# Patient Record
Sex: Male | Born: 1980
Health system: Southern US, Community
[De-identification: ages and names within clinical notes are randomized; demographics above are authoritative.]

## PROBLEM LIST (undated history)

## (undated) DIAGNOSIS — I10 Essential (primary) hypertension: Secondary | ICD-10-CM

## (undated) DIAGNOSIS — Z9889 Other specified postprocedural states: Secondary | ICD-10-CM

## (undated) HISTORY — PX: WISDOM TOOTH EXTRACTION: SHX21

## (undated) HISTORY — DX: Other specified postprocedural states: Z98.890

## (undated) HISTORY — PX: ESOPHAGOGASTRODUODENOSCOPY: SHX1529

## (undated) HISTORY — DX: Essential (primary) hypertension: I10

## (undated) HISTORY — PX: BACK SURGERY: SHX140

---

## 2013-01-05 ENCOUNTER — Ambulatory Visit (INDEPENDENT_AMBULATORY_CARE_PROVIDER_SITE_OTHER): Payer: 59 | Admitting: Sports Medicine

## 2013-01-05 ENCOUNTER — Encounter: Payer: Self-pay | Admitting: Sports Medicine

## 2013-01-05 VITALS — BP 148/99 | HR 60 | Ht 67.0 in | Wt 244.0 lb

## 2013-01-05 DIAGNOSIS — Z0000 Encounter for general adult medical examination without abnormal findings: Secondary | ICD-10-CM | POA: Insufficient documentation

## 2013-01-05 DIAGNOSIS — Z299 Encounter for prophylactic measures, unspecified: Secondary | ICD-10-CM

## 2013-01-05 DIAGNOSIS — L237 Allergic contact dermatitis due to plants, except food: Secondary | ICD-10-CM | POA: Insufficient documentation

## 2013-01-05 DIAGNOSIS — I10 Essential (primary) hypertension: Secondary | ICD-10-CM

## 2013-01-05 DIAGNOSIS — L255 Unspecified contact dermatitis due to plants, except food: Secondary | ICD-10-CM

## 2013-01-05 MED ORDER — LOSARTAN POTASSIUM-HCTZ 100-25 MG PO TABS: 1.0000 | ORAL_TABLET | Freq: Every day | ORAL | Status: DC

## 2013-01-05 MED ORDER — NEBIVOLOL HCL 20 MG PO TABS: 20.0000 mg | ORAL_TABLET | Freq: Every day | ORAL | Status: DC

## 2013-01-05 NOTE — Assessment & Plan Note (Signed)
Checking routine bloodwork. 

## 2013-01-05 NOTE — Assessment & Plan Note (Signed)
Solu-Medrol 125 and Depo-Medrol 80 intramuscular in the office.

## 2013-01-05 NOTE — Progress Notes (Signed)
  Subjective:    CC: Establish care.   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  Hypertension: Has been on Bystolic and losartan for a long time now, needs refills. He has already been through thiazides as well as amlodipine and was intolerant.  Poison ivy dermatitis: Present for several days on both arms and groin, intensely pruritic, persistent.  Preventative measures: Due for some routine blood work.  Past medical history, Surgical history, Family history not pertinant except as noted below, Social history, Allergies, and medications have been entered into the medical record, reviewed, and no changes needed.   Review of Systems: No headache, visual changes, nausea, vomiting, diarrhea, constipation, dizziness, abdominal pain, skin rash, fevers, chills, night sweats, swollen lymph nodes, weight loss, chest pain, body aches, joint swelling, muscle aches, shortness of breath, mood changes, visual or auditory hallucinations.  Objective:    General: Well Developed, well nourished, and in no acute distress.  Neuro: Alert and oriented x3, extra-ocular muscles intact, sensation grossly intact.  HEENT: Normocephalic, atraumatic, pupils equal round reactive to light, neck supple, no masses, no lymphadenopathy, thyroid nonpalpable.  Skin: Warm and dry, there is a papulovesicular rash on both forearms as well as his groin.  Cardiac: Regular rate and rhythm, no murmurs rubs or gallops.  Respiratory: Clear to auscultation bilaterally. Not using accessory muscles, speaking in full sentences.  Abdominal: Soft, nontender, nondistended, positive bowel sounds, no masses, no organomegaly.  Musculoskeletal: Shoulder, elbow, wrist, hip, knee, ankle stable, and with full range of motion.  Impression and Recommendations:    The patient was counselled, risk factors were discussed, anticipatory guidance given.

## 2013-01-05 NOTE — Assessment & Plan Note (Signed)
Refilling Bystolic and Hyzaar.

## 2013-01-06 MED ORDER — METHYLPREDNISOLONE ACETATE 80 MG/ML IJ SUSP
80.0000 mg | Freq: Once | INTRAMUSCULAR | Status: AC
  Administered 2013-01-06: 80 mg via INTRAMUSCULAR

## 2013-01-06 MED ORDER — METHYLPREDNISOLONE SODIUM SUCC 125 MG IJ SOLR
125.0000 mg | Freq: Once | INTRAMUSCULAR | Status: AC
  Administered 2013-01-06: 125 mg via INTRAMUSCULAR

## 2013-01-06 NOTE — Addendum Note (Signed)
Addended by: Pixie Casino on: 01/06/2013 08:48 AM   Modules accepted: Orders

## 2013-03-01 ENCOUNTER — Other Ambulatory Visit: Payer: Self-pay | Admitting: Family Medicine

## 2013-03-01 DIAGNOSIS — L237 Allergic contact dermatitis due to plants, except food: Secondary | ICD-10-CM

## 2013-03-01 MED ORDER — PREDNISONE 50 MG PO TABS: 50.0000 mg | ORAL_TABLET | Freq: Every day | ORAL | Status: DC

## 2013-03-01 NOTE — Assessment & Plan Note (Addendum)
Prednisone 50mg  x7 days

## 2013-03-01 NOTE — Progress Notes (Signed)
Itchy rash on L foot 9 days ago.  Thinks came in contact w/ poison ivy/oak from cutting down bushes in back yard. Developed rash next day Has calamine lotion, benadryl, salt water soaks w/o much benefit.   PE mild papular erythematous rash on dorsum of foot and 3-4th toes.  Shelly Flatten, MD Family Medicine PGY-3 03/01/2013, 5:02 PM

## 2014-02-05 ENCOUNTER — Other Ambulatory Visit: Payer: Self-pay | Admitting: Sports Medicine

## 2014-02-05 DIAGNOSIS — I10 Essential (primary) hypertension: Secondary | ICD-10-CM

## 2014-02-05 MED ORDER — NEBIVOLOL HCL 20 MG PO TABS: 20.0000 mg | ORAL_TABLET | Freq: Every day | ORAL | Status: DC

## 2014-02-05 MED ORDER — LOSARTAN POTASSIUM-HCTZ 100-25 MG PO TABS: 1.0000 | ORAL_TABLET | Freq: Every day | ORAL | Status: DC

## 2014-04-16 ENCOUNTER — Encounter: Payer: Self-pay | Admitting: *Deleted

## 2014-04-16 NOTE — Progress Notes (Signed)
PPD placed on left forearm on 04/16/2014 at 3:30 pm. Lot # [id number]; Exp:05/09/2016  Read on 04/18/14 at 3:45 pm. Result is negative.Busick, Kevin Fenton

## 2015-02-12 ENCOUNTER — Encounter: Payer: Self-pay | Admitting: Sports Medicine

## 2015-02-12 ENCOUNTER — Other Ambulatory Visit: Payer: Self-pay | Admitting: Sports Medicine

## 2015-02-12 ENCOUNTER — Ambulatory Visit (INDEPENDENT_AMBULATORY_CARE_PROVIDER_SITE_OTHER): Payer: 59 | Admitting: Sports Medicine

## 2015-02-12 VITALS — BP 136/82 | HR 69 | Ht 67.0 in | Wt 241.0 lb

## 2015-02-12 DIAGNOSIS — Z0000 Encounter for general adult medical examination without abnormal findings: Secondary | ICD-10-CM | POA: Diagnosis not present

## 2015-02-12 DIAGNOSIS — E785 Hyperlipidemia, unspecified: Secondary | ICD-10-CM | POA: Diagnosis not present

## 2015-02-12 DIAGNOSIS — R011 Cardiac murmur, unspecified: Secondary | ICD-10-CM

## 2015-02-12 DIAGNOSIS — I10 Essential (primary) hypertension: Secondary | ICD-10-CM | POA: Diagnosis not present

## 2015-02-12 LAB — HEMOGLOBIN A1C
Hgb A1c MFr Bld: 5.2 % (ref <5.7)
Mean Plasma Glucose: 103 mg/dL (ref <117)

## 2015-02-12 MED ORDER — NEBIVOLOL HCL 20 MG PO TABS: 20.0000 mg | ORAL_TABLET | Freq: Every day | ORAL | Status: DC

## 2015-02-12 MED ORDER — AZILSARTAN MEDOXOMIL 80 MG PO TABS: ORAL_TABLET | ORAL | Status: DC

## 2015-02-12 MED ORDER — LOSARTAN POTASSIUM-HCTZ 100-25 MG PO TABS: 1.0000 | ORAL_TABLET | Freq: Every day | ORAL | Status: DC

## 2015-02-12 NOTE — Progress Notes (Signed)
  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 Denies any headaches, visual changes, chest pain.  Preventive measures: Has not yet obtained routine blood work.  Past medical history, Surgical history, Family history not pertinant except as noted below, Social history, Allergies, and medications have been entered into the medical record, reviewed, and no changes needed.   Review of Systems: No headache, visual changes, nausea, vomiting, diarrhea, constipation, dizziness, abdominal pain, skin rash, fevers, chills, night sweats, swollen lymph nodes, weight loss, chest pain, body aches, joint swelling, muscle aches, shortness of breath, mood changes, visual or auditory hallucinations.  Objective:    General: Well Developed, well nourished, and in no acute distress.  Neuro: Alert and oriented x3, extra-ocular muscles intact, sensation grossly intact. Cranial nerves II through XII are intact, motor, sensory, and coordinative functions are all intact. HEENT: Normocephalic, atraumatic, pupils equal round reactive to light, neck supple, no masses, no lymphadenopathy, thyroid nonpalpable. Oropharynx, nasopharynx, external ear canals are unremarkable. Skin: Warm and dry, no rashes noted.  Cardiac: Regular rate and rhythm, there is a 4-6/5 systolic ejection murmur heard best in the left third parasternal intercostal space. Respiratory: Clear to auscultation bilaterally. Not using accessory muscles, speaking in full sentences.  Abdominal: Soft, nontender, nondistended, positive bowel sounds, no masses, no organomegaly.  Musculoskeletal: Shoulder, elbow, wrist, hip, knee, ankle stable, and with full range of motion.  Impression and  Recommendations:    The patient was counselled, risk factors were discussed, anticipatory guidance given.

## 2015-02-12 NOTE — Assessment & Plan Note (Signed)
Continue Bystolic. Discontinue Hyzaar, switching to [place]

## 2015-02-12 NOTE — Assessment & Plan Note (Signed)
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

## 2015-02-12 NOTE — Assessment & Plan Note (Signed)
Annual physical exam as above, patient will go ahead and get his blood work drawn. We did discuss his weight, and he will try to lose some more weight on his own, we discussed dieting strategies, he will proceed with weight loss medication if no response.

## 2015-02-13 ENCOUNTER — Telehealth (HOSPITAL_COMMUNITY): Payer: Self-pay | Admitting: *Deleted

## 2015-02-13 DIAGNOSIS — E785 Hyperlipidemia, unspecified: Secondary | ICD-10-CM | POA: Insufficient documentation

## 2015-02-13 LAB — COMPREHENSIVE METABOLIC PANEL
ALT: 38 U/L (ref 9–46)
AST: 24 U/L (ref 10–40)
Albumin: 4.2 g/dL (ref 3.6–5.1)
Alkaline Phosphatase: 57 U/L (ref 40–115)
Calcium: 9.4 mg/dL (ref 8.6–10.3)
Sodium: 140 mmol/L (ref 135–146)
Total Bilirubin: 0.5 mg/dL (ref 0.2–1.2)
Total Protein: 6.9 g/dL (ref 6.1–8.1)

## 2015-02-13 LAB — TSH: TSH: 1.32 u[IU]/mL (ref 0.350–4.500)

## 2015-02-13 LAB — CBC
HCT: 44 % (ref 39.0–52.0)
Hemoglobin: 15.5 g/dL (ref 13.0–17.0)
MCH: 30 pg (ref 26.0–34.0)
MCHC: 35.2 g/dL (ref 30.0–36.0)
MCV: 85.3 fL (ref 78.0–100.0)
MPV: 11.4 fL (ref 8.6–12.4)
Platelets: 249 10*3/uL (ref 150–400)
RBC: 5.16 MIL/uL (ref 4.22–5.81)
RDW: 13.6 % (ref 11.5–15.5)
WBC: 7.5 10*3/uL (ref 4.0–10.5)

## 2015-02-13 LAB — COMPREHENSIVE METABOLIC PANEL WITH GFR
BUN: 15 mg/dL (ref 7–25)
CO2: 24 mmol/L (ref 20–31)
Chloride: 103 mmol/L (ref 98–110)
Creat: 1.1 mg/dL (ref 0.60–1.35)
Glucose, Bld: 116 mg/dL — ABNORMAL HIGH (ref 65–99)
Potassium: 3.8 mmol/L (ref 3.5–5.3)

## 2015-02-13 LAB — LIPID PANEL
Cholesterol: 192 mg/dL (ref 125–200)
HDL: 33 mg/dL — ABNORMAL LOW (ref >=40)
Total CHOL/HDL Ratio: 5.8 ratio — ABNORMAL HIGH (ref <=5.0)
Triglycerides: 518 mg/dL — ABNORMAL HIGH (ref <150)

## 2015-02-13 LAB — LDL CHOLESTEROL, DIRECT: Direct LDL: 108 mg/dL (ref <130)

## 2015-02-13 LAB — VITAMIN D 25 HYDROXY (VIT D DEFICIENCY, FRACTURES): Vit D, 25-Hydroxy: 17 ng/mL — ABNORMAL LOW (ref 30–100)

## 2015-02-13 MED ORDER — VITAMIN D (ERGOCALCIFEROL) 1.25 MG (50000 UNIT) PO CAPS: 50000.0000 [IU] | ORAL_CAPSULE | ORAL | Status: DC

## 2015-02-13 NOTE — Addendum Note (Signed)
Addended by: Silverio Decamp on: 02/13/2015 08:32 AM   Modules accepted: Orders

## 2015-02-13 NOTE — Assessment & Plan Note (Signed)
Triglycerides are up, adding direct LDL.

## 2015-05-06 ENCOUNTER — Other Ambulatory Visit: Payer: Self-pay | Admitting: Sports Medicine

## 2015-05-06 DIAGNOSIS — I10 Essential (primary) hypertension: Secondary | ICD-10-CM

## 2015-05-06 MED ORDER — AZILSARTAN MEDOXOMIL 80 MG PO TABS: ORAL_TABLET | ORAL | Status: DC

## 2015-05-15 ENCOUNTER — Other Ambulatory Visit: Payer: Self-pay | Admitting: Sports Medicine

## 2015-05-15 DIAGNOSIS — R011 Cardiac murmur, unspecified: Secondary | ICD-10-CM

## 2015-05-15 DIAGNOSIS — I10 Essential (primary) hypertension: Secondary | ICD-10-CM

## 2015-05-30 ENCOUNTER — Ambulatory Visit (HOSPITAL_COMMUNITY): Payer: 59

## 2015-06-25 MED FILL — BYSTOLIC 20 MG TABLET: 20 | 90 days supply | Qty: 90 | Fill #1

## 2015-06-26 ENCOUNTER — Ambulatory Visit (HOSPITAL_COMMUNITY): Payer: 59 | Attending: Cardiovascular Disease

## 2015-06-26 ENCOUNTER — Other Ambulatory Visit: Payer: Self-pay

## 2015-06-26 DIAGNOSIS — Z6837 Body mass index (BMI) 37.0-37.9, adult: Secondary | ICD-10-CM | POA: Diagnosis not present

## 2015-06-26 DIAGNOSIS — E785 Hyperlipidemia, unspecified: Secondary | ICD-10-CM | POA: Diagnosis not present

## 2015-06-26 DIAGNOSIS — I10 Essential (primary) hypertension: Secondary | ICD-10-CM | POA: Insufficient documentation

## 2015-06-26 DIAGNOSIS — R011 Cardiac murmur, unspecified: Secondary | ICD-10-CM | POA: Diagnosis present

## 2015-06-26 DIAGNOSIS — E669 Obesity, unspecified: Secondary | ICD-10-CM | POA: Diagnosis not present

## 2015-07-01 ENCOUNTER — Ambulatory Visit (INDEPENDENT_AMBULATORY_CARE_PROVIDER_SITE_OTHER): Payer: 59 | Admitting: Sports Medicine

## 2015-07-01 VITALS — BP 137/96 | HR 63 | Temp 98.2°F | Resp 16 | Ht 72.0 in | Wt 231.3 lb

## 2015-07-01 DIAGNOSIS — R011 Cardiac murmur, unspecified: Secondary | ICD-10-CM | POA: Diagnosis not present

## 2015-07-01 DIAGNOSIS — D229 Melanocytic nevi, unspecified: Secondary | ICD-10-CM | POA: Insufficient documentation

## 2015-07-01 DIAGNOSIS — I10 Essential (primary) hypertension: Secondary | ICD-10-CM

## 2015-07-01 DIAGNOSIS — D239 Other benign neoplasm of skin, unspecified: Secondary | ICD-10-CM | POA: Diagnosis not present

## 2015-07-01 MED ORDER — NEBIVOLOL HCL 20 MG PO TABS: 20.0000 mg | ORAL_TABLET | Freq: Every day | ORAL | Status: DC

## 2015-07-01 MED ORDER — AZILSARTAN MEDOXOMIL 80 MG PO TABS: ORAL_TABLET | ORAL | Status: DC

## 2015-07-01 NOTE — Assessment & Plan Note (Signed)
Refilling Edarbi, Bystolic.

## 2015-07-01 NOTE — Progress Notes (Signed)
  Subjective:    CC: Follow-up  HPI: Hypertension: Improved on current medications  Systolic murmur: Echo was negative.  Atypical nevi: Wonders if he needs to see a dermatologist.  Past medical history, Surgical history, Family history not pertinant except as noted below, Social history, Allergies, and medications have been entered into the medical record, reviewed, and no changes needed.   Review of Systems: No fevers, chills, night sweats, weight loss, chest pain, or shortness of breath.   Objective:    General: Well Developed, well nourished, and in no acute distress.  Neuro: Alert and oriented x3, extra-ocular muscles intact, sensation grossly intact.  HEENT: Normocephalic, atraumatic, pupils equal round reactive to light, neck supple, no masses, no lymphadenopathy, thyroid nonpalpable.  Skin: Warm and dry, no rashes. There are several atypical nevi, 2 of them are worrisome, one of the abdomen and one on the back that are asymmetric, variegated, with irregular borders, and approximately 1.5cm across. Cardiac: Regular rate and rhythm, no murmurs rubs or gallops, no lower extremity edema.  Respiratory: Clear to auscultation bilaterally. Not using accessory muscles, speaking in full sentences.  Impression and Recommendations:

## 2015-07-01 NOTE — Assessment & Plan Note (Signed)
Benign murmur, echo was negative.

## 2015-07-01 NOTE — Assessment & Plan Note (Signed)
Patient will return for shave biopsies, there was one on the abdomen and one on the back that were asymmetric, variegated.

## 2015-07-12 MED FILL — EDARBI 80 MG TABLET: 80 | 30 days supply | Qty: 30 | Fill #0

## 2015-08-13 MED FILL — EDARBI 80 MG TABLET: 80 | 30 days supply | Qty: 30 | Fill #1

## 2015-09-16 MED FILL — EDARBI 80 MG TABLET: 80 | 30 days supply | Qty: 30 | Fill #2

## 2015-10-02 MED FILL — BYSTOLIC 20 MG TABLET: 20 | 90 days supply | Qty: 90 | Fill #2

## 2015-10-25 MED FILL — EDARBI 80 MG TABLET: 80 | 30 days supply | Qty: 30 | Fill #3

## 2015-11-25 MED FILL — EDARBI 80 MG TABLET: 80 | 30 days supply | Qty: 30 | Fill #4

## 2015-12-15 ENCOUNTER — Other Ambulatory Visit: Payer: Self-pay | Admitting: Family Medicine

## 2015-12-15 ENCOUNTER — Encounter: Payer: Self-pay | Admitting: Family Medicine

## 2015-12-15 MED ORDER — AMOXICILLIN 500 MG PO CAPS: 500.0000 mg | ORAL_CAPSULE | Freq: Two times a day (BID) | ORAL | Status: DC

## 2015-12-15 NOTE — Progress Notes (Signed)
Reports ear pain and fullness x1 day. Noted some sinus pressure in the last several days. Exam shows significant erythema and bulging of right ear. Left tympanic membrane normal. 10 day course of Amoxicillin prescribed.   Dr. Gerlean Ren 12/15/15, 6:15 PM

## 2016-01-02 MED FILL — EDARBI 80 MG TABLET: 80 | 30 days supply | Qty: 30 | Fill #5

## 2016-01-06 MED FILL — BYSTOLIC 20 MG TABLET: 20 | 90 days supply | Qty: 90 | Fill #3

## 2016-01-20 ENCOUNTER — Other Ambulatory Visit: Payer: Self-pay | Admitting: Sports Medicine

## 2016-01-20 ENCOUNTER — Encounter: Payer: Self-pay | Admitting: Sports Medicine

## 2016-01-20 ENCOUNTER — Ambulatory Visit (INDEPENDENT_AMBULATORY_CARE_PROVIDER_SITE_OTHER): Payer: 59 | Admitting: Sports Medicine

## 2016-01-20 ENCOUNTER — Ambulatory Visit (INDEPENDENT_AMBULATORY_CARE_PROVIDER_SITE_OTHER): Payer: 59

## 2016-01-20 DIAGNOSIS — R509 Fever, unspecified: Secondary | ICD-10-CM

## 2016-01-20 DIAGNOSIS — D229 Melanocytic nevi, unspecified: Secondary | ICD-10-CM

## 2016-01-20 DIAGNOSIS — D239 Other benign neoplasm of skin, unspecified: Secondary | ICD-10-CM

## 2016-01-20 DIAGNOSIS — R61 Generalized hyperhidrosis: Secondary | ICD-10-CM

## 2016-01-20 DIAGNOSIS — R102 Pelvic and perineal pain: Secondary | ICD-10-CM

## 2016-01-20 DIAGNOSIS — K651 Peritoneal abscess: Secondary | ICD-10-CM

## 2016-01-20 DIAGNOSIS — E785 Hyperlipidemia, unspecified: Secondary | ICD-10-CM

## 2016-01-20 LAB — PSA: PSA: 0.4 ng/mL (ref <=4.0)

## 2016-01-20 LAB — CBC WITH DIFFERENTIAL/PLATELET
Basophils Absolute: 267 {cells}/uL — ABNORMAL HIGH (ref 0–200)
Basophils Relative: 3 %
Eosinophils Absolute: 89 cells/uL (ref 15–500)
Eosinophils Relative: 1 %
HCT: 43.5 % (ref 38.5–50.0)
Hemoglobin: 15.3 g/dL (ref 13.2–17.1)
Lymphocytes Relative: 55 %
Lymphs Abs: 4895 {cells}/uL — ABNORMAL HIGH (ref 850–3900)
MCH: 30.2 pg (ref 27.0–33.0)
MCHC: 35.2 g/dL (ref 32.0–36.0)
MCV: 86 fL (ref 80.0–100.0)
MPV: 9.9 fL (ref 7.5–12.5)
Monocytes Absolute: 712 cells/uL (ref 200–950)
Monocytes Relative: 8 %
Neutro Abs: 2937 cells/uL (ref 1500–7800)
Neutrophils Relative %: 33 %
Platelets: 227 K/uL (ref 140–400)
RBC: 5.06 MIL/uL (ref 4.20–5.80)
RDW: 13.4 % (ref 11.0–15.0)
WBC: 8.9 K/uL (ref 3.8–10.8)

## 2016-01-20 LAB — COMPREHENSIVE METABOLIC PANEL WITH GFR
ALT: 145 U/L — ABNORMAL HIGH (ref 9–46)
AST: 60 U/L — ABNORMAL HIGH (ref 10–40)
Alkaline Phosphatase: 110 U/L (ref 40–115)
BUN: 14 mg/dL (ref 7–25)
CO2: 28 mmol/L (ref 20–31)
Calcium: 9.7 mg/dL (ref 8.6–10.3)
Creat: 1.23 mg/dL (ref 0.60–1.35)
Glucose, Bld: 108 mg/dL — ABNORMAL HIGH (ref 65–99)
Sodium: 136 mmol/L (ref 135–146)
Total Bilirubin: 0.9 mg/dL (ref 0.2–1.2)

## 2016-01-20 LAB — LIPID PANEL
Cholesterol: 204 mg/dL — ABNORMAL HIGH (ref 125–200)
HDL: 31 mg/dL — ABNORMAL LOW (ref >=40)
LDL Cholesterol: 124 mg/dL (ref <130)
Total CHOL/HDL Ratio: 6.6 ratio — ABNORMAL HIGH (ref <=5.0)
Triglycerides: 244 mg/dL — ABNORMAL HIGH (ref <150)
VLDL: 49 mg/dL — ABNORMAL HIGH (ref <30)

## 2016-01-20 LAB — COMPREHENSIVE METABOLIC PANEL
Albumin: 4.1 g/dL (ref 3.6–5.1)
Chloride: 102 mmol/L (ref 98–110)
Potassium: 4.7 mmol/L (ref 3.5–5.3)
Total Protein: 7.7 g/dL (ref 6.1–8.1)

## 2016-01-20 MED ORDER — IOPAMIDOL (ISOVUE-300) INJECTION 61%
100.0000 mL | Freq: Once | INTRAVENOUS | Status: AC | PRN
  Administered 2016-01-20: 100 mL via INTRAVENOUS

## 2016-01-20 MED ORDER — CIPROFLOXACIN HCL 750 MG PO TABS: 750.0000 mg | ORAL_TABLET | Freq: Two times a day (BID) | ORAL | 0 refills | Status: AC

## 2016-01-20 MED ORDER — DOXYCYCLINE HYCLATE 100 MG PO TABS: 100.0000 mg | ORAL_TABLET | Freq: Two times a day (BID) | ORAL | 0 refills | Status: AC

## 2016-01-20 MED ORDER — METRONIDAZOLE 500 MG PO TABS: 500.0000 mg | ORAL_TABLET | Freq: Two times a day (BID) | ORAL | 0 refills | Status: AC

## 2016-01-20 MED FILL — DOXYCYCLINE HYCLATE 100 MG: 100 | 7 days supply | Qty: 14 | Fill #0

## 2016-01-20 MED FILL — metroNIDAZOLE 500 MG TABS: 500 | 7 days supply | Qty: 14 | Fill #0

## 2016-01-20 MED FILL — CIPROFLOXACIN HCL 750 MG TA: 750 | 10 days supply | Qty: 20 | Fill #0

## 2016-01-20 NOTE — Assessment & Plan Note (Addendum)
Question viral serology versus ischioanal abscess. History of a worsening anal fissure initially. CBC, CMP, viral serologies, Quantiferon Gold, blood cultures. Calling the lab to add on a PSA CT abdomen and pelvis with IV contrast.

## 2016-01-20 NOTE — Assessment & Plan Note (Signed)
Rechecking lipids. 

## 2016-01-20 NOTE — Progress Notes (Signed)
  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 He did have a history of a anal fissure, that has worsened in pain. No cough, shortness of breath, sore throat, no sinus pressure, no abdominal pain, nausea, vomiting, diarrhea, constipation. No shortness of breath, no skin or soft tissue infections.  [place]: Has noted 2 on his buttocks that he is worried about  Past medical history, Surgical history, Family history not pertinant except as noted below, Social history, Allergies, and medications have been entered into the medical record, reviewed, and no changes needed.   Review of Systems: No fevers, chills, night sweats, weight loss, chest pain, or shortness of breath.   Objective:    General: Well Developed, well nourished, and in no acute distress.  Neuro: Alert and oriented x3, extra-ocular muscles intact, sensation grossly intact.  HEENT: Normocephalic, atraumatic, pupils equal round reactive to light, neck supple, no masses, no lymphadenopathy, thyroid nonpalpable.  Skin: Warm and dry, no rashes.There are a few nevi on his buttock, a couple are very dark and variegated. Cardiac: Regular rate and rhythm, no murmurs rubs or gallops, no lower extremity edema.  Respiratory: Clear to auscultation bilaterally. Not using accessory muscles, speaking in full sentences. Abdomen: Soft, nontender, not distended, normal bowel sounds, no palpable masses, guarding, rigidity, or rebound tenderness. Rectal: Minimally erythematous, visible external hemorrhoid at about 12:00, there is significant pain with palpation of his rectum.  CT of the pelvis with contrast does not show any evidence of collections.  Impression and Recommendations:    Abscess of male pelvis ([place]) Question viral serology versus ischioanal abscess. History of a worsening anal fissure initially. CBC, CMP, viral  serologies, Quantiferon Gold, blood cultures. Calling the lab to add on a PSA CT abdomen and pelvis with IV contrast.  Atypical nevi Variegated and dark nevi on both buttocks, he will return for biopsy.  Hyperlipidemia Rechecking lipids

## 2016-01-20 NOTE — Assessment & Plan Note (Signed)
Variegated and dark nevi on both buttocks, he will return for biopsy.

## 2016-01-21 LAB — EPSTEIN-BARR VIRUS VCA, IGM: EBV VCA IgM: >160 U/mL — ABNORMAL HIGH

## 2016-01-21 LAB — EPSTEIN-BARR VIRUS VCA, IGG: EBV VCA IgG: 562 U/mL — ABNORMAL HIGH

## 2016-01-21 LAB — CMV IGM: CMV IgM: >240 AU/mL — ABNORMAL HIGH (ref <30.00)

## 2016-01-21 LAB — PATHOLOGIST SMEAR REVIEW

## 2016-01-22 LAB — QUANTIFERON TB GOLD ASSAY (BLOOD)
Interferon Gamma Release Assay: NEGATIVE
Mitogen-Nil: >10 [IU]/mL
Quantiferon Nil Value: 0.15 IU/mL
Quantiferon Tb Ag Minus Nil Value: 0.01 [IU]/mL

## 2016-01-26 LAB — CULTURE, BLOOD (SINGLE): Organism ID, Bacteria: NO GROWTH

## 2016-02-03 ENCOUNTER — Encounter: Payer: Self-pay | Admitting: Sports Medicine

## 2016-02-03 MED ORDER — NITROGLYCERIN 0.4 % RE OINT: 1.0000 [in_us] | TOPICAL_OINTMENT | Freq: Two times a day (BID) | RECTAL | 3 refills | Status: DC | PRN

## 2016-02-18 MED FILL — EDARBI 80 MG TABLET: 80 | 30 days supply | Qty: 30 | Fill #6

## 2016-03-17 MED FILL — EDARBI 80 MG TABLET: 80 | 30 days supply | Qty: 30 | Fill #7

## 2016-04-23 MED FILL — EDARBI 80 MG TABLET: 80 | 30 days supply | Qty: 30 | Fill #8

## 2016-04-23 MED FILL — BYSTOLIC 20 MG TABLET: 20 | 90 days supply | Qty: 90 | Fill #0

## 2016-06-11 MED FILL — EDARBI 80 MG TABLET: 80 | 30 days supply | Qty: 30 | Fill #9

## 2016-07-27 MED FILL — AZITHROMYCIN 250 MG TABLET: 250 | 5 days supply | Qty: 6 | Fill #0

## 2016-08-13 ENCOUNTER — Ambulatory Visit (INDEPENDENT_AMBULATORY_CARE_PROVIDER_SITE_OTHER): Payer: 59 | Admitting: Sports Medicine

## 2016-08-13 ENCOUNTER — Encounter: Payer: Self-pay | Admitting: Sports Medicine

## 2016-08-13 DIAGNOSIS — D229 Melanocytic nevi, unspecified: Secondary | ICD-10-CM | POA: Diagnosis not present

## 2016-08-13 DIAGNOSIS — I10 Essential (primary) hypertension: Secondary | ICD-10-CM

## 2016-08-13 DIAGNOSIS — Z0000 Encounter for general adult medical examination without abnormal findings: Secondary | ICD-10-CM

## 2016-08-13 DIAGNOSIS — E7800 Pure hypercholesterolemia, unspecified: Secondary | ICD-10-CM

## 2016-08-13 LAB — COMPREHENSIVE METABOLIC PANEL WITH GFR
ALT: 22 U/L (ref 9–46)
AST: 23 U/L (ref 10–40)
Alkaline Phosphatase: 78 U/L (ref 40–115)
BUN: 14 mg/dL (ref 7–25)
Creat: 1.05 mg/dL (ref 0.60–1.35)
Glucose, Bld: 83 mg/dL (ref 65–99)
Potassium: 4.7 mmol/L (ref 3.5–5.3)
Sodium: 137 mmol/L (ref 135–146)

## 2016-08-13 LAB — LIPID PANEL W/REFLEX DIRECT LDL
Cholesterol: 174 mg/dL (ref <200)
HDL: 67 mg/dL (ref >40)
LDL-Cholesterol: 92 mg/dL
Non-HDL Cholesterol (Calc): 107 mg/dL (ref <130)
Total CHOL/HDL Ratio: 2.6 ratio (ref <5.0)
Triglycerides: 64 mg/dL (ref <150)

## 2016-08-13 LAB — CBC
HCT: 48.6 % (ref 38.5–50.0)
Hemoglobin: 16.3 g/dL (ref 13.2–17.1)
MCH: 30.5 pg (ref 27.0–33.0)
MCHC: 33.5 g/dL (ref 32.0–36.0)
MCV: 90.8 fL (ref 80.0–100.0)
MPV: 10.5 fL (ref 7.5–12.5)
Platelets: 209 K/uL (ref 140–400)
RBC: 5.35 MIL/uL (ref 4.20–5.80)
RDW: 14.5 % (ref 11.0–15.0)
WBC: 6.4 K/uL (ref 3.8–10.8)

## 2016-08-13 LAB — COMPREHENSIVE METABOLIC PANEL
Albumin: 4.3 g/dL (ref 3.6–5.1)
CO2: 26 mmol/L (ref 20–31)
Calcium: 9.5 mg/dL (ref 8.6–10.3)
Chloride: 103 mmol/L (ref 98–110)
Total Bilirubin: 1.6 mg/dL — ABNORMAL HIGH (ref 0.2–1.2)
Total Protein: 7.4 g/dL (ref 6.1–8.1)

## 2016-08-13 LAB — HEMOGLOBIN A1C
Hgb A1c MFr Bld: 4.8 % (ref <5.7)
Mean Plasma Glucose: 91 mg/dL

## 2016-08-13 NOTE — Progress Notes (Signed)
  Subjective:    CC: Annual physical exam  HPI:  This is a very pleasant 36 year old male, he is currently a sports medicine fellow. He has been offered a job by [employer]. Overall he is doing well.  Suspicious nevi: Never returned for biopsy.  Hyperlipidemia: Has lost a significant amount of weight and is eager to recheck.  Hypertension: Discontinued his Edarbi, blood pressure continues to be controlled. He is still taking Bystolic.  Past medical history:  Negative.  See flowsheet/record as well for more information.  Surgical history: Negative.  See flowsheet/record as well for more information.  Family history: Negative.  See flowsheet/record as well for more information.  Social history: Negative.  See flowsheet/record as well for more information.  Allergies, and medications have been entered into the medical record, reviewed, and no changes needed.    Review of Systems: No headache, visual changes, nausea, vomiting, diarrhea, constipation, dizziness, abdominal pain, skin rash, fevers, chills, night sweats, swollen lymph nodes, weight loss, chest pain, body aches, joint swelling, muscle aches, shortness of breath, mood changes, visual or auditory hallucinations.  Objective:    General: Well Developed, well nourished, and in no acute distress.  Neuro: Alert and oriented x3, extra-ocular muscles intact, sensation grossly intact. Cranial nerves II through XII are intact, motor, sensory, and coordinative functions are all intact. HEENT: Normocephalic, atraumatic, pupils equal round reactive to light, neck supple, no masses, no lymphadenopathy, thyroid nonpalpable. Oropharynx, nasopharynx, external ear canals are unremarkable. Skin: Warm and dry, no rashes noted.  Cardiac: Regular rate and rhythm, no murmurs rubs or gallops.  Respiratory: Clear to auscultation bilaterally. Not using accessory muscles, speaking in full sentences.  Abdominal: Soft, nontender, nondistended, positive bowel  sounds, no masses, no organomegaly.  Musculoskeletal: Shoulder, elbow, wrist, hip, knee, ankle stable, and with full range of motion.  Impression and Recommendations:    The patient was counselled, risk factors were discussed, anticipatory guidance given.  Essential hypertension, benign Essentially diet controlled now, he will discontinue his Bystolic and keep me updated regarding his blood pressures.   Annual physical exam Physical as above, routine blood work ordered.  Atypical nevi Patient does need to return for biopsy, nevi on both buttocks are variegated and dark.  Hyperlipidemia Rechecking lipids, he has lost a great deal of weight

## 2016-08-13 NOTE — Assessment & Plan Note (Signed)
Physical as above, routine blood work ordered.

## 2016-08-13 NOTE — Assessment & Plan Note (Signed)
Rechecking lipids, he has lost a great deal of weight

## 2016-08-13 NOTE — Assessment & Plan Note (Signed)
Essentially diet controlled now, he will discontinue his Bystolic and keep me updated regarding his blood pressures.

## 2016-08-13 NOTE — Assessment & Plan Note (Signed)
Patient does need to return for biopsy, nevi on both buttocks are variegated and dark.

## 2016-08-14 LAB — VITAMIN D 25 HYDROXY (VIT D DEFICIENCY, FRACTURES): Vit D, 25-Hydroxy: 32 ng/mL (ref 30–100)

## 2016-08-14 LAB — HIV ANTIBODY (ROUTINE TESTING W REFLEX): HIV 1&2 Ab, 4th Generation: NONREACTIVE

## 2017-04-14 DIAGNOSIS — H5213 Myopia, bilateral: Secondary | ICD-10-CM | POA: Diagnosis not present

## 2017-04-14 DIAGNOSIS — D485 Neoplasm of uncertain behavior of skin: Secondary | ICD-10-CM | POA: Diagnosis not present

## 2017-04-14 DIAGNOSIS — L814 Other melanin hyperpigmentation: Secondary | ICD-10-CM | POA: Diagnosis not present

## 2017-04-14 DIAGNOSIS — D229 Melanocytic nevi, unspecified: Secondary | ICD-10-CM | POA: Diagnosis not present

## 2017-04-14 DIAGNOSIS — D225 Melanocytic nevi of trunk: Secondary | ICD-10-CM | POA: Diagnosis not present

## 2017-04-14 DIAGNOSIS — L821 Other seborrheic keratosis: Secondary | ICD-10-CM | POA: Diagnosis not present

## 2017-06-21 ENCOUNTER — Other Ambulatory Visit: Payer: Self-pay | Admitting: Family Medicine

## 2017-06-21 MED ORDER — AMOXICILLIN-POT CLAVULANATE 875-125 MG PO TABS: 1.0000 | ORAL_TABLET | Freq: Two times a day (BID) | ORAL | 0 refills | Status: AC

## 2017-06-21 MED FILL — AMOX-CLAV 875-125 MG TABLET: 875-125 | 10 days supply | Qty: 20 | Fill #0

## 2017-06-21 NOTE — Progress Notes (Signed)
augmen

## 2017-07-12 ENCOUNTER — Other Ambulatory Visit: Payer: Self-pay | Admitting: Nurse Practitioner

## 2017-07-12 DIAGNOSIS — S61239A Puncture wound without foreign body of unspecified finger without damage to nail, initial encounter: Secondary | ICD-10-CM

## 2017-07-12 MED ORDER — CEPHALEXIN 500 MG PO CAPS: 500.0000 mg | ORAL_CAPSULE | Freq: Two times a day (BID) | ORAL | 0 refills | Status: DC

## 2017-07-12 MED FILL — CEPHALEXIN 500 MG CAPSULE: 500 | 7 days supply | Qty: 14 | Fill #0

## 2017-07-12 NOTE — Progress Notes (Signed)
Puncture wound with purulent drainage. Swelling and redness along DIP. Normal joint ROM. No joint effusion. Normal cap refill.

## 2017-07-13 ENCOUNTER — Other Ambulatory Visit: Payer: Self-pay | Admitting: *Deleted

## 2017-07-13 MED ORDER — DOXYCYCLINE HYCLATE 100 MG PO TABS: 100.0000 mg | ORAL_TABLET | Freq: Two times a day (BID) | ORAL | 0 refills | Status: DC

## 2017-07-13 MED FILL — DOXYCYCLINE HYCLATE 100 MG: 100 | 10 days supply | Qty: 20 | Fill #0

## 2017-07-15 ENCOUNTER — Encounter (HOSPITAL_COMMUNITY): Payer: Self-pay | Admitting: Surgery

## 2017-07-15 ENCOUNTER — Ambulatory Visit (HOSPITAL_COMMUNITY): Payer: 59 | Admitting: Certified Registered"

## 2017-07-15 ENCOUNTER — Other Ambulatory Visit: Payer: Self-pay

## 2017-07-15 ENCOUNTER — Ambulatory Visit (HOSPITAL_COMMUNITY)
Admission: AD | Admit: 2017-07-15 | Discharge: 2017-07-15 | Disposition: A | Discharge status: Home / Self Care | Payer: 59 | Source: Ambulatory Visit | Attending: Orthopedic Surgery | Admitting: Orthopedic Surgery

## 2017-07-15 ENCOUNTER — Encounter (HOSPITAL_COMMUNITY)
Admission: AD | Disposition: A | Discharge status: Home / Self Care | Payer: Self-pay | Source: Ambulatory Visit | Attending: Orthopedic Surgery

## 2017-07-15 DIAGNOSIS — L02512 Cutaneous abscess of left hand: Secondary | ICD-10-CM | POA: Diagnosis not present

## 2017-07-15 DIAGNOSIS — I10 Essential (primary) hypertension: Secondary | ICD-10-CM | POA: Diagnosis not present

## 2017-07-15 DIAGNOSIS — E669 Obesity, unspecified: Secondary | ICD-10-CM | POA: Diagnosis not present

## 2017-07-15 DIAGNOSIS — I96 Gangrene, not elsewhere classified: Secondary | ICD-10-CM | POA: Diagnosis not present

## 2017-07-15 DIAGNOSIS — Z6831 Body mass index (BMI) 31.0-31.9, adult: Secondary | ICD-10-CM | POA: Diagnosis not present

## 2017-07-15 DIAGNOSIS — Z79899 Other long term (current) drug therapy: Secondary | ICD-10-CM | POA: Insufficient documentation

## 2017-07-15 DIAGNOSIS — M71042 Abscess of bursa, left hand: Secondary | ICD-10-CM | POA: Diagnosis not present

## 2017-07-15 DIAGNOSIS — R011 Cardiac murmur, unspecified: Secondary | ICD-10-CM | POA: Diagnosis not present

## 2017-07-15 DIAGNOSIS — L03012 Cellulitis of left finger: Secondary | ICD-10-CM

## 2017-07-15 DIAGNOSIS — E785 Hyperlipidemia, unspecified: Secondary | ICD-10-CM | POA: Diagnosis not present

## 2017-07-15 HISTORY — PX: INCISION AND DRAINAGE WOUND WITH FOREIGN BODY REMOVAL: SHX5635

## 2017-07-15 LAB — BASIC METABOLIC PANEL
Anion gap: 12 (ref 5–15)
BUN: 9 mg/dL (ref 6–20)
CHLORIDE: 103 mmol/L (ref 101–111)
CO2: 22 mmol/L (ref 22–32)
Calcium: 9.2 mg/dL (ref 8.9–10.3)
Creatinine, Ser: 0.94 mg/dL (ref 0.61–1.24)
GFR calc Af Amer: >60 mL/min (ref >60)
Glucose, Bld: 95 mg/dL (ref 65–99)
POTASSIUM: 3.8 mmol/L (ref 3.5–5.1)
SODIUM: 137 mmol/L (ref 135–145)

## 2017-07-15 LAB — HEMOGLOBIN: HEMOGLOBIN: 14.7 g/dL (ref 13.0–17.0)

## 2017-07-15 SURGERY — INCISION AND DRAINAGE WOUND WITH FOREIGN BODY REMOVAL
Anesthesia: General | Site: Hand | Laterality: Left

## 2017-07-15 MED ORDER — MIDAZOLAM HCL 2 MG/2ML IJ SOLN: 0.5000 mg | Freq: Once | INTRAMUSCULAR | Status: DC | PRN

## 2017-07-15 MED ORDER — ONDANSETRON HCL 4 MG/2ML IJ SOLN
INTRAMUSCULAR | Status: AC
  Filled 2017-07-15: qty 2

## 2017-07-15 MED ORDER — CHLORHEXIDINE GLUCONATE 4 % EX LIQD: 60.0000 mL | Freq: Once | CUTANEOUS | Status: DC

## 2017-07-15 MED ORDER — DEXAMETHASONE SODIUM PHOSPHATE 10 MG/ML IJ SOLN
INTRAMUSCULAR | Status: DC | PRN
  Administered 2017-07-15: 10 mg via INTRAVENOUS

## 2017-07-15 MED ORDER — LACTATED RINGERS IV SOLN
INTRAVENOUS | Status: DC
  Administered 2017-07-15: 15:00:00 via INTRAVENOUS

## 2017-07-15 MED ORDER — BUPIVACAINE HCL (PF) 0.25 % IJ SOLN
INTRAMUSCULAR | Status: AC
  Filled 2017-07-15: qty 30

## 2017-07-15 MED ORDER — MEPERIDINE HCL 50 MG/ML IJ SOLN: 6.2500 mg | INTRAMUSCULAR | Status: DC | PRN

## 2017-07-15 MED ORDER — FENTANYL CITRATE (PF) 250 MCG/5ML IJ SOLN
INTRAMUSCULAR | Status: AC
  Filled 2017-07-15: qty 5

## 2017-07-15 MED ORDER — PROPOFOL 10 MG/ML IV BOLUS
INTRAVENOUS | Status: AC
  Filled 2017-07-15: qty 20

## 2017-07-15 MED ORDER — LIDOCAINE 2% (20 MG/ML) 5 ML SYRINGE
INTRAMUSCULAR | Status: DC | PRN
  Administered 2017-07-15: 60 mg via INTRAVENOUS

## 2017-07-15 MED ORDER — LIDOCAINE 2% (20 MG/ML) 5 ML SYRINGE
INTRAMUSCULAR | Status: AC
  Filled 2017-07-15: qty 5

## 2017-07-15 MED ORDER — PROPOFOL 10 MG/ML IV BOLUS
INTRAVENOUS | Status: DC | PRN
  Administered 2017-07-15: 200 mg via INTRAVENOUS

## 2017-07-15 MED ORDER — CEFAZOLIN SODIUM-DEXTROSE 2-4 GM/100ML-% IV SOLN
2.0000 g | INTRAVENOUS | Status: AC
  Administered 2017-07-15: 2 g via INTRAVENOUS
  Filled 2017-07-15: qty 100

## 2017-07-15 MED ORDER — HYDROMORPHONE HCL 1 MG/ML IJ SOLN
INTRAMUSCULAR | Status: AC
  Administered 2017-07-15: 0.5 mg via INTRAVENOUS
  Filled 2017-07-15: qty 1

## 2017-07-15 MED ORDER — MIDAZOLAM HCL 2 MG/2ML IJ SOLN
INTRAMUSCULAR | Status: DC | PRN
  Administered 2017-07-15: 2 mg via INTRAVENOUS

## 2017-07-15 MED ORDER — FENTANYL CITRATE (PF) 250 MCG/5ML IJ SOLN
INTRAMUSCULAR | Status: DC | PRN
  Administered 2017-07-15: 100 ug via INTRAVENOUS
  Administered 2017-07-15: 50 ug via INTRAVENOUS

## 2017-07-15 MED ORDER — PROMETHAZINE HCL 25 MG/ML IJ SOLN
6.2500 mg | INTRAMUSCULAR | Status: DC | PRN
Start: 2017-07-15 — End: 2017-07-15

## 2017-07-15 MED ORDER — HYDROMORPHONE HCL 1 MG/ML IJ SOLN
INTRAMUSCULAR | Status: AC
  Filled 2017-07-15: qty 1

## 2017-07-15 MED ORDER — FENTANYL CITRATE (PF) 100 MCG/2ML IJ SOLN
INTRAMUSCULAR | Status: AC
  Administered 2017-07-15: 50 ug via INTRAVENOUS
  Filled 2017-07-15: qty 2

## 2017-07-15 MED ORDER — FENTANYL CITRATE (PF) 100 MCG/2ML IJ SOLN
50.0000 ug | Freq: Once | INTRAMUSCULAR | Status: AC
Start: 2017-07-15 — End: 2017-07-15
  Administered 2017-07-15: 50 ug via INTRAVENOUS
  Filled 2017-07-15: qty 1

## 2017-07-15 MED ORDER — SODIUM CHLORIDE 0.9 % IR SOLN
Status: DC | PRN
  Administered 2017-07-15: 1000 mL
  Administered 2017-07-15: 3000 mL

## 2017-07-15 MED ORDER — MIDAZOLAM HCL 2 MG/2ML IJ SOLN
INTRAMUSCULAR | Status: AC
  Filled 2017-07-15: qty 2

## 2017-07-15 MED ORDER — ONDANSETRON HCL 4 MG/2ML IJ SOLN
INTRAMUSCULAR | Status: DC | PRN
  Administered 2017-07-15: 4 mg via INTRAVENOUS

## 2017-07-15 MED ORDER — HYDROMORPHONE HCL 1 MG/ML IJ SOLN
0.2500 mg | INTRAMUSCULAR | Status: DC | PRN
  Administered 2017-07-15 (×3): 0.5 mg via INTRAVENOUS

## 2017-07-15 MED ORDER — DEXAMETHASONE SODIUM PHOSPHATE 10 MG/ML IJ SOLN
INTRAMUSCULAR | Status: AC
  Filled 2017-07-15: qty 1

## 2017-07-15 SURGICAL SUPPLY — 59 items
BANDAGE ACE 3X5.8 VEL STRL LF (GAUZE/BANDAGES/DRESSINGS) ×3 IMPLANT
BANDAGE ACE 4X5 VEL STRL LF (GAUZE/BANDAGES/DRESSINGS) ×3 IMPLANT
BNDG COHESIVE 1X5 TAN STRL LF (GAUZE/BANDAGES/DRESSINGS) IMPLANT
BNDG CONFORM 2 STRL LF (GAUZE/BANDAGES/DRESSINGS) IMPLANT
BNDG ESMARK 4X9 LF (GAUZE/BANDAGES/DRESSINGS) ×3 IMPLANT
BNDG GAUZE ELAST 4 BULKY (GAUZE/BANDAGES/DRESSINGS) ×3 IMPLANT
CORD BIPOLAR FORCEPS 12FT (ELECTRODE) ×3 IMPLANT
CORDS BIPOLAR (ELECTRODE) ×3 IMPLANT
COVER SURGICAL LIGHT HANDLE (MISCELLANEOUS) ×3 IMPLANT
CUFF TOURNIQUET SINGLE 18IN (TOURNIQUET CUFF) ×3 IMPLANT
CUFF TOURNIQUET SINGLE 24IN (TOURNIQUET CUFF) IMPLANT
DRAIN PENROSE 1/4X12 LTX STRL (WOUND CARE) IMPLANT
DRAPE SURG 17X23 STRL (DRAPES) ×3 IMPLANT
DRSG ADAPTIC 3X8 NADH LF (GAUZE/BANDAGES/DRESSINGS) ×3 IMPLANT
ELECT REM PT RETURN 9FT ADLT (ELECTROSURGICAL)
ELECTRODE REM PT RTRN 9FT ADLT (ELECTROSURGICAL) IMPLANT
GAUZE SPONGE 4X4 12PLY STRL (GAUZE/BANDAGES/DRESSINGS) ×3 IMPLANT
GAUZE XEROFORM 1X8 LF (GAUZE/BANDAGES/DRESSINGS) ×3 IMPLANT
GAUZE XEROFORM 5X9 LF (GAUZE/BANDAGES/DRESSINGS) IMPLANT
GLOVE BIOGEL PI IND STRL 8.5 (GLOVE) ×1 IMPLANT
GLOVE BIOGEL PI INDICATOR 8.5 (GLOVE) ×2
GLOVE SURG ORTHO 8.0 STRL STRW (GLOVE) ×3 IMPLANT
GOWN STRL REUS W/ TWL LRG LVL3 (GOWN DISPOSABLE) ×3 IMPLANT
GOWN STRL REUS W/ TWL XL LVL3 (GOWN DISPOSABLE) ×1 IMPLANT
GOWN STRL REUS W/TWL LRG LVL3 (GOWN DISPOSABLE) ×6
GOWN STRL REUS W/TWL XL LVL3 (GOWN DISPOSABLE) ×2
HANDPIECE INTERPULSE COAX TIP (DISPOSABLE)
KIT BASIN OR (CUSTOM PROCEDURE TRAY) ×3 IMPLANT
KIT ROOM TURNOVER OR (KITS) ×3 IMPLANT
LOOP VESSEL MINI RED (MISCELLANEOUS) ×3 IMPLANT
MANIFOLD NEPTUNE II (INSTRUMENTS) ×3 IMPLANT
NEEDLE HYPO 25GX1X1/2 BEV (NEEDLE) IMPLANT
NS IRRIG 1000ML POUR BTL (IV SOLUTION) ×3 IMPLANT
PACK ORTHO EXTREMITY (CUSTOM PROCEDURE TRAY) ×3 IMPLANT
PAD ARMBOARD 7.5X6 YLW CONV (MISCELLANEOUS) ×6 IMPLANT
PAD CAST 4YDX4 CTTN HI CHSV (CAST SUPPLIES) ×1 IMPLANT
PADDING CAST COTTON 4X4 STRL (CAST SUPPLIES) ×2
SET CYSTO W/LG BORE CLAMP LF (SET/KITS/TRAYS/PACK) ×3 IMPLANT
SET HNDPC FAN SPRY TIP SCT (DISPOSABLE) IMPLANT
SOAP 2 % CHG 4 OZ (WOUND CARE) ×3 IMPLANT
SPLINT FIBERGLASS 3X35 (CAST SUPPLIES) ×3 IMPLANT
SPONGE LAP 18X18 X RAY DECT (DISPOSABLE) ×3 IMPLANT
SPONGE LAP 4X18 X RAY DECT (DISPOSABLE) ×3 IMPLANT
SUCTION FRAZIER HANDLE 10FR (MISCELLANEOUS) ×2
SUCTION TUBE FRAZIER 10FR DISP (MISCELLANEOUS) ×1 IMPLANT
SUT ETHILON 4 0 PS 2 18 (SUTURE) IMPLANT
SUT ETHILON 5 0 P 3 18 (SUTURE)
SUT NYLON ETHILON 5-0 P-3 1X18 (SUTURE) IMPLANT
SUT PROLENE 4 0 PS 2 18 (SUTURE) ×3 IMPLANT
SWAB COLLECTION DEVICE MRSA (MISCELLANEOUS) ×3 IMPLANT
SWAB CULTURE ESWAB REG 1ML (MISCELLANEOUS) IMPLANT
SYR CONTROL 10ML LL (SYRINGE) IMPLANT
TOWEL OR 17X24 6PK STRL BLUE (TOWEL DISPOSABLE) ×3 IMPLANT
TOWEL OR 17X26 10 PK STRL BLUE (TOWEL DISPOSABLE) ×3 IMPLANT
TUBE CONNECTING 12'X1/4 (SUCTIONS) ×1
TUBE CONNECTING 12X1/4 (SUCTIONS) ×2 IMPLANT
UNDERPAD 30X30 (UNDERPADS AND DIAPERS) ×3 IMPLANT
WATER STERILE IRR 1000ML POUR (IV SOLUTION) ×3 IMPLANT
YANKAUER SUCT BULB TIP NO VENT (SUCTIONS) ×3 IMPLANT

## 2017-07-15 NOTE — Anesthesia Preprocedure Evaluation (Signed)
Anesthesia Evaluation  Patient identified by MRN, date of birth, ID band Patient awake    Reviewed: Allergy & Precautions, NPO status , Patient's Chart, lab work & pertinent test results  History of Anesthesia Complications Negative for: history of anesthetic complications  Airway Mallampati: II  TM Distance: >3 FB Neck ROM: Full    Dental  (+) Chipped, Caps, Dental Advisory Given   Pulmonary neg pulmonary ROS,    breath sounds clear to auscultation       Cardiovascular hypertension (off of meds since lost weight), (-) angina Rhythm:Regular Rate:Normal  '17 ECHO: EF 55-60%, valves OK   Neuro/Psych negative neurological ROS     GI/Hepatic negative GI ROS, Neg liver ROS,   Endo/Other  negative endocrine ROS  Renal/GU negative Renal ROS     Musculoskeletal   Abdominal (+) + obese,   Peds  Hematology negative hematology ROS (+)   Anesthesia Other Findings   Reproductive/Obstetrics                             Anesthesia Physical Anesthesia Plan  ASA: II  Anesthesia Plan: General   Post-op Pain Management:    Induction: Intravenous  PONV Risk Score and Plan: 3 and Ondansetron and Dexamethasone  Airway Management Planned: LMA  Additional Equipment:   Intra-op Plan:   Post-operative Plan:   Informed Consent: I have reviewed the patients History and Physical, chart, labs and discussed the procedure including the risks, benefits and alternatives for the proposed anesthesia with the patient or authorized representative who has indicated his/her understanding and acceptance.   Dental advisory given  Plan Discussed with: CRNA and Surgeon  Anesthesia Plan Comments: (Plan routine monitors, GA- LMA OK)        Anesthesia Quick Evaluation

## 2017-07-15 NOTE — H&P (Signed)
  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 About 4 days ago, a blister appeared on the finger. He was able to drain it and began taking Doxycycline.  He has had continued swelling, redness, warmth, and drainage from the finger. He denies fever, chills, nausea, vomiting, or diarrhea.  He was seen in our office for further evaluation. Discussed the reason and rationale for surgical intervention. Discussed the surgical procedure, including the risks versus benefits, and the post-operative recovery.  The patient is here today for surgery.   Past Medical History:  Diagnosis Date  . History of microdiscectomy 2006 and 2014   herniated disc  . Hypertension     No past surgical history on file.  No family history on file. Social History:  reports that  has never smoked. he has never used smokeless tobacco. He reports that he drinks alcohol. He reports that he does not use drugs.  Allergies: No Known Allergies  No medications prior to admission.    No results found for this or any previous visit (from the past 48 hour(s)). No results found.  ROS NO RECENT ILLNESSES OR HOSPITALIZATIONS  There were no vitals taken for this visit. Physical Exam  General Appearance:  Alert, cooperative, no distress, appears stated age  Head:  Normocephalic, without obvious abnormality, atraumatic  Eyes:  Pupils equal, conjunctiva/corneas clear,         Throat: Lips, mucosa, and tongue normal; teeth and gums normal  Neck: No visible masses     Lungs:   respirations unlabored  Chest Wall:  No tenderness or deformity  Heart:  Regular rate and rhythm,  Abdomen:   Soft, non-tender,         Extremities: LUE: marked swelling dorsally, fluctuant area dorsally index finger Limited mobility to long/ring/small  Pulses: 2+ and symmetric  Skin: Skin  color, texture, turgor normal, no rashes or lesions     Neurologic: Normal    Assessment LEFT INDEX FINGER ABSCESS   Plan LEFT INDEX FINGER INCISION AND DRAINAGE  R/B/A DISCUSSED WITH PT IN OFFICE.  PT VOICED UNDERSTANDING OF PLAN CONSENT SIGNED DAY OF SURGERY PT SEEN AND EXAMINED PRIOR TO OPERATIVE PROCEDURE/DAY OF SURGERY SITE MARKED. QUESTIONS ANSWERED WILL GO HOME FOLLOWING SURGERY  WE ARE PLANNING SURGERY FOR YOUR UPPER EXTREMITY. THE RISKS AND BENEFITS OF SURGERY INCLUDE BUT NOT LIMITED TO BLEEDING INFECTION, DAMAGE TO NEARBY NERVES ARTERIES TENDONS, FAILURE OF SURGERY TO ACCOMPLISH ITS INTENDED GOALS, PERSISTENT SYMPTOMS AND NEED FOR FURTHER SURGICAL INTERVENTION. WITH THIS IN MIND WE WILL PROCEED. I HAVE DISCUSSED WITH THE PATIENT THE PRE AND POSTOPERATIVE REGIMEN AND THE DOS AND DON'TS. PT VOICED UNDERSTANDING AND INFORMED CONSENT SIGNED.   Brynda Peon 07/15/2017, 9:42 AM

## 2017-07-15 NOTE — Op Note (Signed)
PREOPERATIVE DIAGNOSIS: Left index finger and dorsum of the hand abscess  POSTOPERATIVE DIAGNOSIS: Same  ATTENDING SURGEON: Dr. Gavin Pound who was scrubbed and present for the entire procedure  ASSISTANT SURGEON: None  ANESTHESIA: Gen. via laryngeal mask airway  OPERATIVE PROCEDURE: #1: Incision and drainage of dorsum aspect of left index finger and hand abscess #2: Excisional debridement of skin subcutaneous tissue of devitalized tissue  IMPLANTS: 2 small red vessel loops  RADIOGRAPHIC INTERPRETATION: None  SURGICAL INDICATIONS: Right-hand-dominant gentleman who had a worsening infection over the dorsal aspect of his index finger and hand. Patient presented the office with the worsening infection. Is recommended the patient undergo the above procedure. Risks benefits and alternatives were discussed in detail the patient in a signed informed consent was obtained. Risks include but not limited to bleeding infection damage to nearby nerves arteries or tendons loss of motion of the wrists and digits incomplete relief of symptoms and need for further surgical intervention.  SURGICAL TECHNIQUE: Patient is probably identified in the preoperative holding area and a mark with a permanent marker made a left index finger to indicate the correct operative site patient brought back to operating room placed supine on anesthesia and table where general anesthesia was administered. A well-padded tourniquet was placed on the left brachium and sealed with the appropriate drape. Left upper extremity is and prepped and draped in normal sterile fashion. A timeout was called the correct site was identified and the procedure then begun. A longitudinal incision was then made directly over the abscess area directly over the PIP joint to the index finger. Dissection carried down through the skin and subcutaneous tissue. Purulence was encountered and wound cultures were taken. Following this excisional debridement of the  skin and subcutaneous tissue and necrotic tissue was then done sharply with scissors and a knife. Following this a counter incision was made directly over the dorsal aspect of the hand. Blunt dissection was then done to carry out extension between these 2 incisions to allow for thorough wound irrigation. 3 L solution was then irrigated throughout the index finger and dorsum of the hand. The wound was irrigated thoroughly. Upon completion of the irrigation 2 vessel loops were then placed in the wound was then loosely reapproximated and closed over the vessel loops status service drains. Following this the Adaptic dressing a sterile compressive bandage was then applied. The patient is then placed in a well-padded volar splint and extubated taken recovery room in good condition.  POSTOPERATIVE PLAN: Patient be discharged to home. Seen back in the office tomorrow for wound check take out the drains and likely begin a therapy regimen. Continue on oral antibiotics until we follow his wound cultures.

## 2017-07-15 NOTE — Anesthesia Procedure Notes (Signed)
Procedure Name: LMA Insertion Date/Time: 07/15/2017 5:14 PM Performed by: Barrington Ellison, CRNA Pre-anesthesia Checklist: Patient identified, Emergency Drugs available, Suction available and Patient being monitored Patient Re-evaluated:Patient Re-evaluated prior to induction Oxygen Delivery Method: Circle System Utilized Preoxygenation: Pre-oxygenation with 100% oxygen Induction Type: IV induction Ventilation: Mask ventilation without difficulty LMA: LMA inserted LMA Size: 5.0 Number of attempts: 1 Placement Confirmation: positive ETCO2 Tube secured with: Tape Dental Injury: Teeth and Oropharynx as per pre-operative assessment

## 2017-07-15 NOTE — Discharge Instructions (Signed)
KEEP BANDAGE CLEAN AND DRY CALL OFFICE FOR F/U APPT [phone] in One day KEEP HAND ELEVATED ABOVE HEART OK TO APPLY ICE TO OPERATIVE AREA CONTACT OFFICE IF ANY WORSENING PAIN OR CONCERNS.

## 2017-07-15 NOTE — Anesthesia Postprocedure Evaluation (Signed)
Anesthesia Post Note  Patient: Victor Ax, MD  Procedure(s) Performed: LEFT INDEX FINGER INCISION AND DRAINAGE (Left Hand)     Patient location during evaluation: PACU Anesthesia Type: General Level of consciousness: awake and alert, patient cooperative and oriented Pain management: pain level controlled Vital Signs Assessment: post-procedure vital signs reviewed and stable Respiratory status: spontaneous breathing, nonlabored ventilation and respiratory function stable Cardiovascular status: blood pressure returned to baseline and stable Postop Assessment: no apparent nausea or vomiting Anesthetic complications: no Comments: Pt and wife understand importance of resuming BP meds    Last Vitals:  Vitals:   07/15/17 1836 07/15/17 1840  BP: (!) 167/104 (!) 167/104  Pulse: 85 75  Resp: 20 17  Temp:  36.9 C  SpO2: 95% 94%    Last Pain:  Vitals:   07/15/17 1833  TempSrc:   PainSc: Asleep                 Janesha Brissette,E. Eidan Muellner

## 2017-07-15 NOTE — Transfer of Care (Signed)
Immediate Anesthesia Transfer of Care Note  Patient: Victor Ax, MD  Procedure(s) Performed: LEFT INDEX FINGER INCISION AND DRAINAGE (Left Hand)  Patient Location: PACU  Anesthesia Type:General  Level of Consciousness: awake, alert  and oriented  Airway & Oxygen Therapy: Patient Spontanous Breathing  Post-op Assessment: Report given to RN  Post vital signs: Reviewed and stable  Last Vitals:  Vitals:   07/15/17 1350  BP: (!) 176/103  Pulse: 90  Resp: 19  Temp: 36.8 C  SpO2: 97%    Last Pain:  Vitals:   07/15/17 1416  TempSrc:   PainSc: 9       Patients Stated Pain Goal: 5 (65/46/50 3546)  Complications: No apparent anesthesia complications

## 2017-07-16 ENCOUNTER — Encounter (HOSPITAL_COMMUNITY): Payer: Self-pay | Admitting: Orthopedic Surgery

## 2017-07-20 LAB — AEROBIC/ANAEROBIC CULTURE W GRAM STAIN (SURGICAL/DEEP WOUND): Gram Stain: NONE SEEN

## 2017-07-20 LAB — AEROBIC/ANAEROBIC CULTURE (SURGICAL/DEEP WOUND)

## 2017-10-18 DIAGNOSIS — F4323 Adjustment disorder with mixed anxiety and depressed mood: Secondary | ICD-10-CM | POA: Diagnosis not present

## 2017-10-27 DIAGNOSIS — F4323 Adjustment disorder with mixed anxiety and depressed mood: Secondary | ICD-10-CM | POA: Diagnosis not present

## 2017-10-29 DIAGNOSIS — L02519 Cutaneous abscess of unspecified hand: Secondary | ICD-10-CM | POA: Diagnosis not present

## 2017-11-03 DIAGNOSIS — F4323 Adjustment disorder with mixed anxiety and depressed mood: Secondary | ICD-10-CM | POA: Diagnosis not present

## 2017-12-08 DIAGNOSIS — F4323 Adjustment disorder with mixed anxiety and depressed mood: Secondary | ICD-10-CM | POA: Diagnosis not present

## 2018-04-02 IMAGING — CT CT PELVIS W/ CM
2 of 4 series · 15 of 46 positions shown, 18 images · IV contrast (iopamidol)
Comparison: None.

CLINICAL DATA: Perianal pain for 2 weeks.  Fever, night sweats.

EXAM:
CT PELVIS WITH CONTRAST
TECHNIQUE: Multidetector CT imaging of the pelvis was performed using the
standard protocol following the bolus administration of intravenous
contrast.
CONTRAST:  100mL [id number]-KFF IOPAMIDOL ([id number]-KFF) INJECTION 61%

[Series 5: coronal st · coronal · 0.71mm/px · 3 of 142 slices shown]
[im 48/142  soft-tissue]
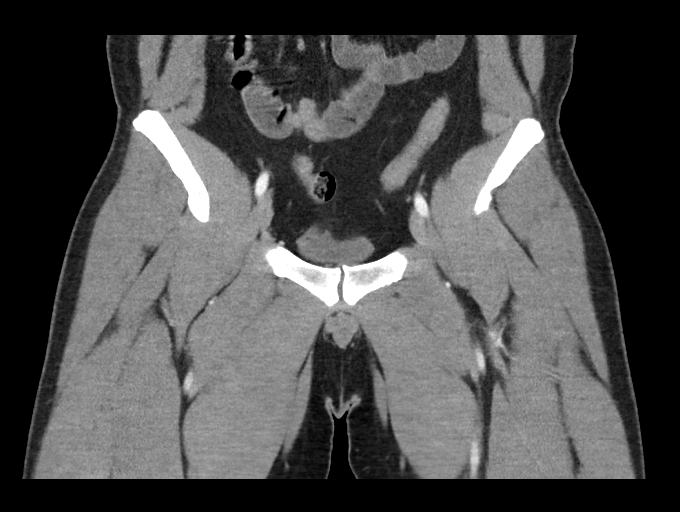
[im 63/142  soft-tissue]
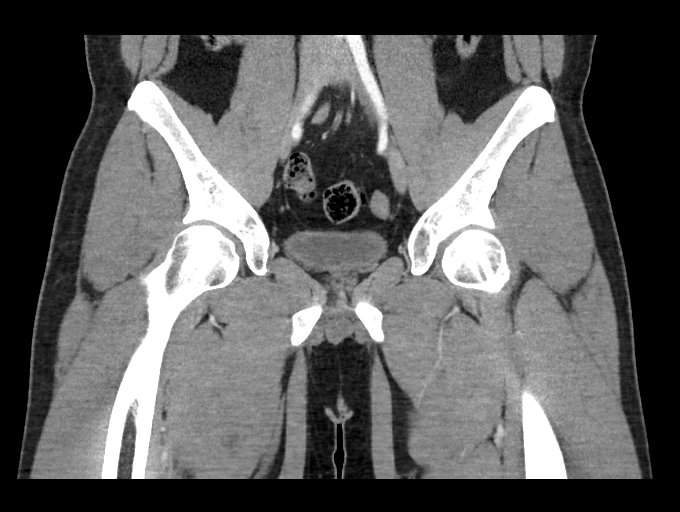
[im 79/142  soft-tissue]
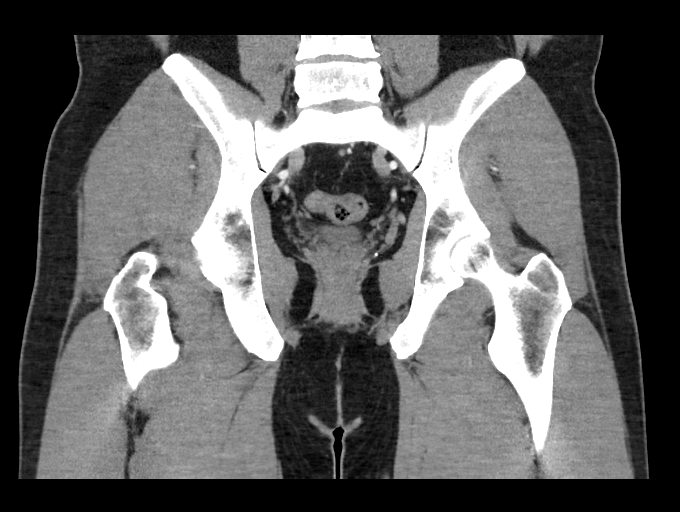

[Series 10: axial soft · axial · 0.98mm/px · z∈[-428,-146]mm · 12 of 158 slices shown, 15 images]
[im 11/158  soft-tissue]
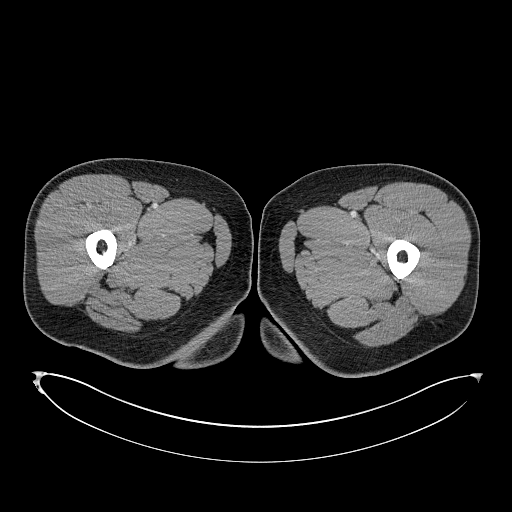
[im 11/158  bone]
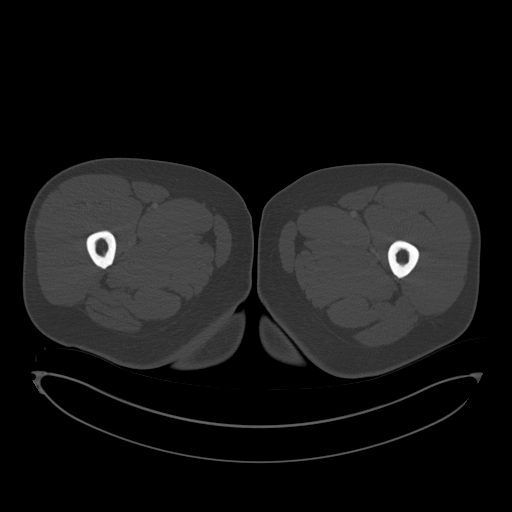
[im 31/158  soft-tissue]
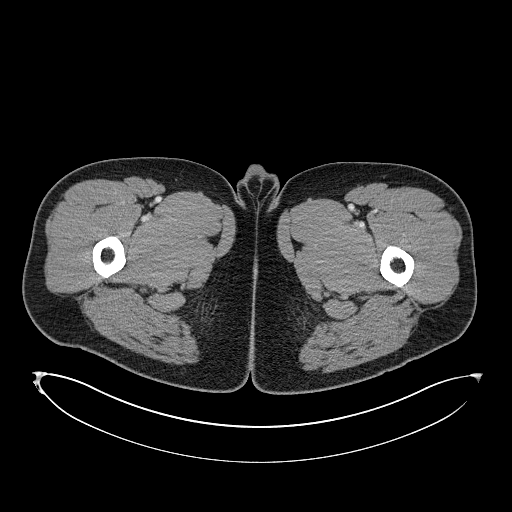
[im 46/158  soft-tissue]
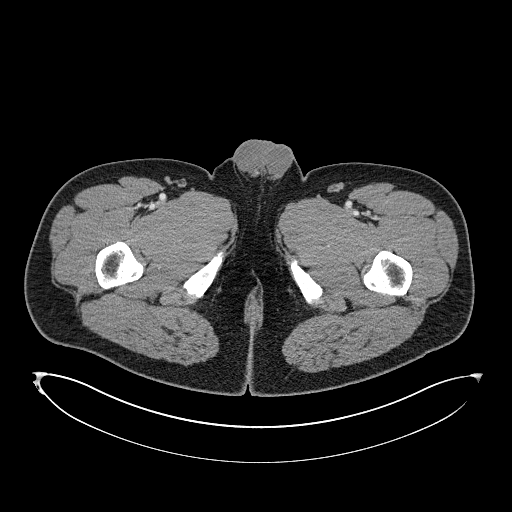
[im 61/158  soft-tissue]
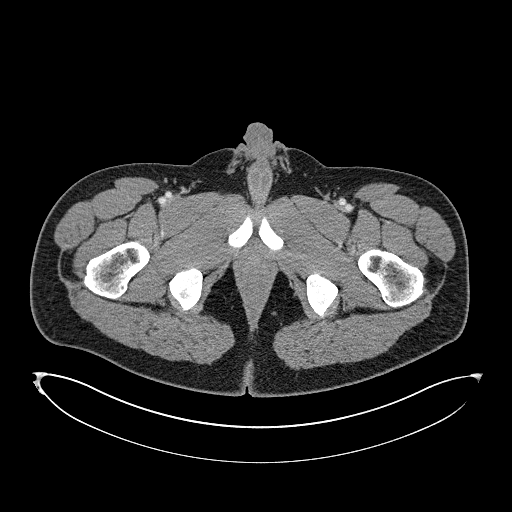
[im 82/158  soft-tissue]
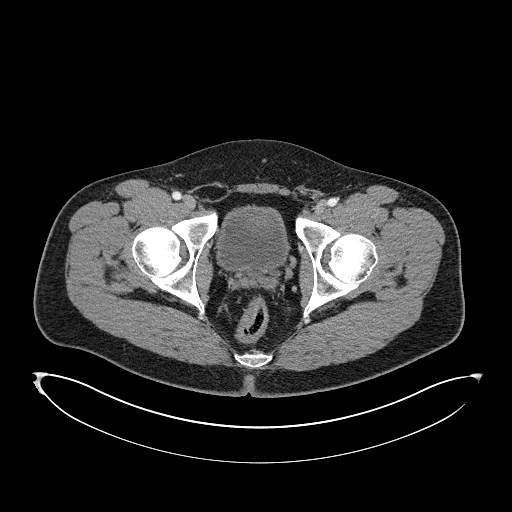
[im 97/158  soft-tissue]
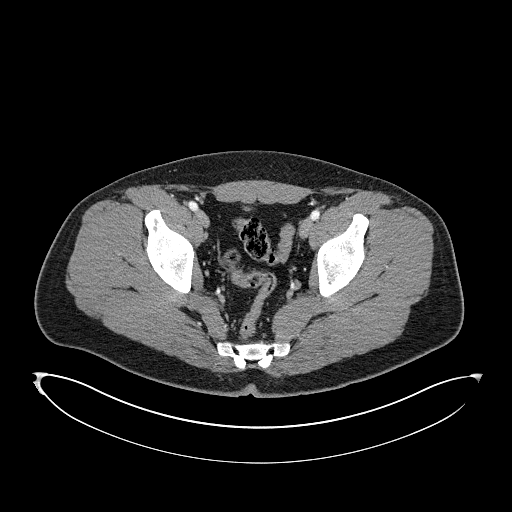
[im 112/158  soft-tissue]
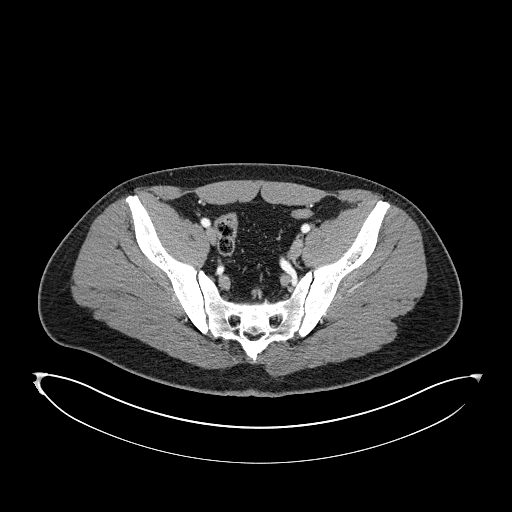
[im 132/158  soft-tissue]
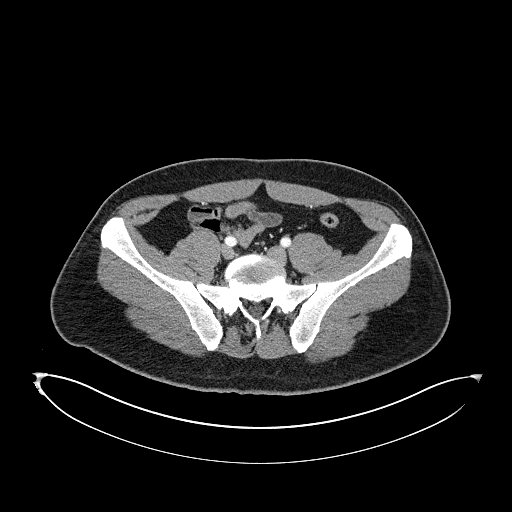
[im 137/158  lung]
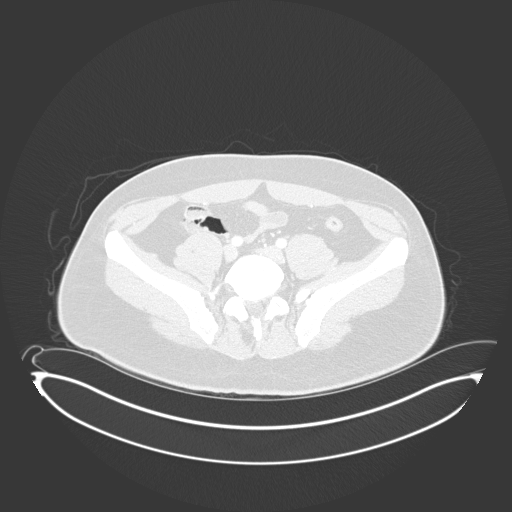
[im 142/158  lung]
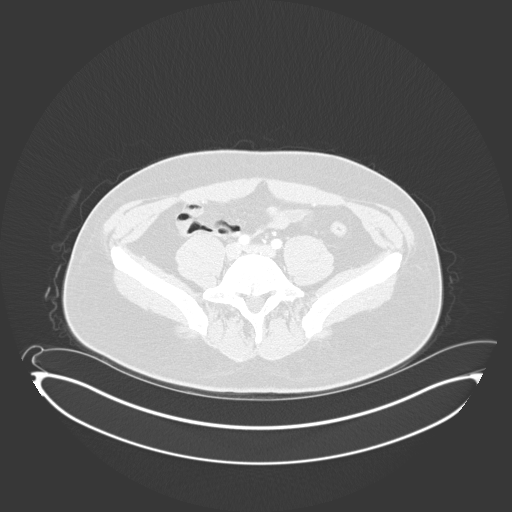
[im 147/158  soft-tissue]
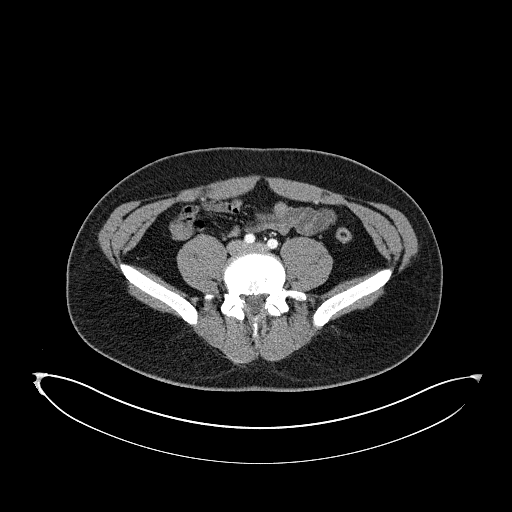
[im 147/158  lung]
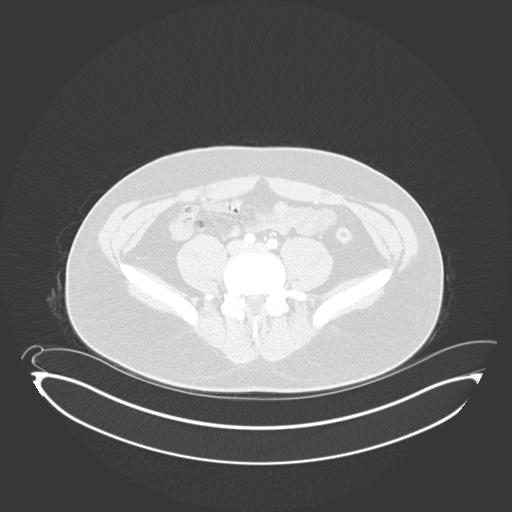
[im 147/158  bone]
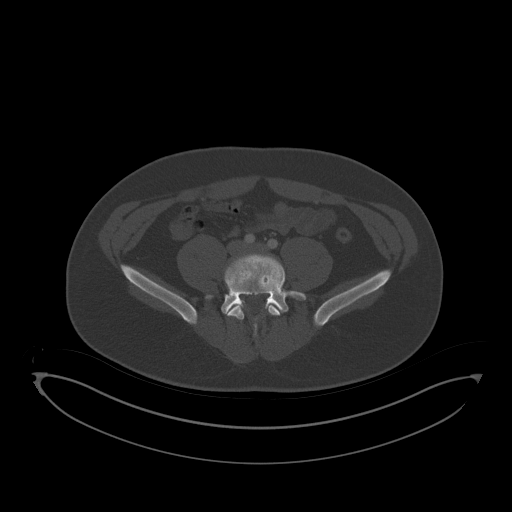
[im 152/158  lung]
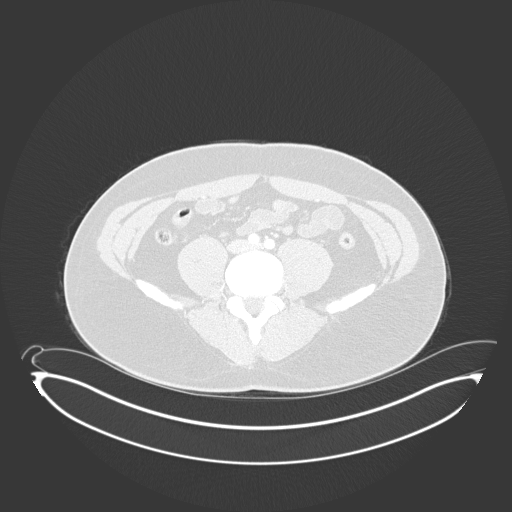

[15 of 46 positions shown; findings below may reference images not displayed]

FINDINGS: Urinary Tract: visualized ureters are decompressed. Urinary bladder
decompressed, grossly unremarkable.

Bowel: Visualized large and small bowel unremarkable. No visible
perianal abnormality.

Vascular/Lymphatic: Iliac and femoral vessels are normal caliber and
patent. No adenopathy.

Reproductive: No visible focal abnormality.

Other: No free fluid or free air. No soft tissue abnormality in the
perianal region.

Musculoskeletal: No acute bony abnormality or focal bone lesion.
IMPRESSION: No acute findings or significant abnormality.

## 2018-06-13 ENCOUNTER — Other Ambulatory Visit: Payer: Self-pay | Admitting: Nurse Practitioner

## 2018-06-13 DIAGNOSIS — J209 Acute bronchitis, unspecified: Secondary | ICD-10-CM

## 2018-06-13 MED ORDER — ALBUTEROL SULFATE 108 (90 BASE) MCG/ACT IN AEPB: 1.0000 | INHALATION_SPRAY | Freq: Four times a day (QID) | RESPIRATORY_TRACT | 0 refills | Status: DC | PRN

## 2018-06-13 MED ORDER — AZITHROMYCIN 250 MG PO TABS: 250.0000 mg | ORAL_TABLET | Freq: Every day | ORAL | 0 refills | Status: DC

## 2018-06-13 MED FILL — PROAIR RESPICLICK INHAL PWD: 108 (90 BAS | 50 days supply | Qty: 1 | Fill #0

## 2018-06-13 MED FILL — AZITHROMYCIN 250 MG TABS: 250 | 5 days supply | Qty: 6 | Fill #0

## 2018-08-12 ENCOUNTER — Other Ambulatory Visit: Payer: Self-pay | Admitting: Nurse Practitioner

## 2018-08-12 MED ORDER — AMOXICILLIN-POT CLAVULANATE 875-125 MG PO TABS: 1.0000 | ORAL_TABLET | Freq: Two times a day (BID) | ORAL | 0 refills | Status: DC

## 2018-08-12 MED FILL — AMOX-CLAV 875-125 MG TABLET: 875-125 | 7 days supply | Qty: 14 | Fill #0

## 2019-11-01 DIAGNOSIS — F4324 Adjustment disorder with disturbance of conduct: Secondary | ICD-10-CM | POA: Diagnosis not present

## 2019-11-08 DIAGNOSIS — F4324 Adjustment disorder with disturbance of conduct: Secondary | ICD-10-CM | POA: Diagnosis not present

## 2019-11-18 ENCOUNTER — Telehealth (HOSPITAL_COMMUNITY): Payer: Self-pay | Admitting: Emergency Medicine

## 2019-11-18 MED ORDER — AMOXICILLIN-POT CLAVULANATE 875-125 MG PO TABS: 1.0000 | ORAL_TABLET | Freq: Two times a day (BID) | ORAL | 0 refills | Status: DC

## 2019-11-18 NOTE — Telephone Encounter (Signed)
Rx for sialadenitis

## 2019-11-22 ENCOUNTER — Telehealth (HOSPITAL_COMMUNITY): Payer: Self-pay | Admitting: Urgent Care

## 2019-11-22 MED ORDER — HYDROCODONE-ACETAMINOPHEN 5-325 MG PO TABS: 1.0000 | ORAL_TABLET | Freq: Four times a day (QID) | ORAL | 0 refills | Status: DC | PRN

## 2019-11-22 NOTE — Telephone Encounter (Signed)
Patient had dental extraction yesterday for cracked tooth. Has had persistent severe pain not helped by NSAID, APAP, tramadol. Will trial hydrocodone as needed for breakthrough severe dental pain.

## 2019-11-23 MED FILL — HYDROCODON-APAP 5-325: 5-325 | 2 days supply | Qty: 10 | Fill #0

## 2019-11-30 DIAGNOSIS — F4324 Adjustment disorder with disturbance of conduct: Secondary | ICD-10-CM | POA: Diagnosis not present

## 2019-12-14 DIAGNOSIS — F4324 Adjustment disorder with disturbance of conduct: Secondary | ICD-10-CM | POA: Diagnosis not present

## 2020-01-18 DIAGNOSIS — F4324 Adjustment disorder with disturbance of conduct: Secondary | ICD-10-CM | POA: Diagnosis not present

## 2020-02-05 DIAGNOSIS — F4324 Adjustment disorder with disturbance of conduct: Secondary | ICD-10-CM | POA: Diagnosis not present

## 2020-02-26 ENCOUNTER — Telehealth: Payer: Self-pay | Admitting: Sports Medicine

## 2020-02-27 MED FILL — FLUARIX QUADRIVALENT 0.5 ML: 0.5 | 1 days supply | Qty: 1 | Fill #0

## 2020-02-28 NOTE — Telephone Encounter (Signed)
Error

## 2020-03-06 ENCOUNTER — Encounter: Payer: Self-pay | Admitting: Sports Medicine

## 2020-03-06 ENCOUNTER — Ambulatory Visit (INDEPENDENT_AMBULATORY_CARE_PROVIDER_SITE_OTHER): Payer: 59 | Admitting: Sports Medicine

## 2020-03-06 ENCOUNTER — Other Ambulatory Visit: Payer: Self-pay

## 2020-03-06 VITALS — BP 161/109 | HR 88 | Ht 72.0 in | Wt 248.0 lb

## 2020-03-06 DIAGNOSIS — I10 Essential (primary) hypertension: Secondary | ICD-10-CM

## 2020-03-06 DIAGNOSIS — E559 Vitamin D deficiency, unspecified: Secondary | ICD-10-CM

## 2020-03-06 DIAGNOSIS — Z0000 Encounter for general adult medical examination without abnormal findings: Secondary | ICD-10-CM | POA: Diagnosis not present

## 2020-03-06 DIAGNOSIS — L649 Androgenic alopecia, unspecified: Secondary | ICD-10-CM | POA: Diagnosis not present

## 2020-03-06 MED ORDER — FINASTERIDE 1 MG PO TABS: 1.0000 mg | ORAL_TABLET | Freq: Every day | ORAL | 3 refills | Status: DC

## 2020-03-06 MED ORDER — MINOXIDIL 5 % EX FOAM
1.0000 | Freq: Two times a day (BID) | CUTANEOUS | 11 refills | Status: DC
Start: 2020-03-06 — End: 2022-06-03

## 2020-03-06 MED ORDER — VALSARTAN 160 MG PO TABS: 160.0000 mg | ORAL_TABLET | Freq: Every day | ORAL | 3 refills | Status: DC

## 2020-03-06 MED FILL — VALSARTAN 160 MG TABS: 160 | 90 days supply | Qty: 90 | Fill #0

## 2020-03-06 MED FILL — MINOXIDIL FOR MEN 5 % FOAM: 5 | 30 days supply | Qty: 60 | Fill #0

## 2020-03-06 NOTE — Progress Notes (Signed)
Subjective:    CC: Annual Physical Exam  HPI:  This patient is here for their annual physical  I reviewed the past medical history, family history, social history, surgical history, and allergies today and no changes were needed.  Please see the problem list section below in epic for further details.  Past Medical History: Past Medical History:  Diagnosis Date  . History of microdiscectomy 2006 and 2014   herniated disc  . Hypertension    Past Surgical History: Past Surgical History:  Procedure Laterality Date  . BACK SURGERY     microdiskectomy x2 2006, 2014  . ESOPHAGOGASTRODUODENOSCOPY     as child  . INCISION AND DRAINAGE WOUND WITH FOREIGN BODY REMOVAL Left 07/15/2017   Procedure: LEFT INDEX FINGER INCISION AND DRAINAGE;  Surgeon: Iran Planas, MD;  Location: [place];  Service: Orthopedics;  Laterality: Left;  . WISDOM TOOTH EXTRACTION     Social History: Social History   Socioeconomic History  . Marital status: Married    Spouse name: Not on file  . Number of children: Not on file  . Years of education: Not on file  . Highest education level: Not on file  Occupational History  . Not on file  Tobacco Use  . Smoking status: Never Smoker  . Smokeless tobacco: Never Used  Substance and Sexual Activity  . Alcohol use: Yes  . Drug use: No  . Sexual activity: Not on file  Other Topics Concern  . Not on file  Social History Narrative  . Not on file   Social Determinants of Health   Financial Resource Strain:   . Difficulty of Paying Living Expenses: Not on file  Food Insecurity:   . Worried About Charity fundraiser in the Last Year: Not on file  . Ran Out of Food in the Last Year: Not on file  Transportation Needs:   . Lack of Transportation (Medical): Not on file  . Lack of Transportation (Non-Medical): Not on file  Physical Activity:   . Days of Exercise per Week: Not on file  . Minutes of Exercise per Session: Not on file  Stress:   . Feeling of Stress  : Not on file  Social Connections:   . Frequency of Communication with Friends and Family: Not on file  . Frequency of Social Gatherings with Friends and Family: Not on file  . Attends Religious Services: Not on file  . Active Member of Clubs or Organizations: Not on file  . Attends Archivist Meetings: Not on file  . Marital Status: Not on file   Family History: No family history on file. Allergies: No Known Allergies Medications: See med rec.  Review of Systems: No headache, visual changes, nausea, vomiting, diarrhea, constipation, dizziness, abdominal pain, skin rash, fevers, chills, night sweats, swollen lymph nodes, weight loss, chest pain, body aches, joint swelling, muscle aches, shortness of breath, mood changes, visual or auditory hallucinations.  Objective:    General: Well Developed, well nourished, and in no acute distress.  Neuro: Alert and oriented x3, extra-ocular muscles intact, sensation grossly intact. Cranial nerves II through XII are intact, motor, sensory, and coordinative functions are all intact. HEENT: Normocephalic, atraumatic, pupils equal round reactive to light, neck supple, no masses, no lymphadenopathy, thyroid nonpalpable. Oropharynx, nasopharynx, external ear canals are unremarkable. Skin: Warm and dry, no rashes noted.  Cardiac: Regular rate and rhythm, no murmurs rubs or gallops.  Respiratory: Clear to auscultation bilaterally. Not using accessory muscles, speaking in full  sentences.  Abdominal: Soft, nontender, nondistended, positive bowel sounds, no masses, no organomegaly.  Musculoskeletal: Shoulder, elbow, wrist, hip, knee, ankle stable, and with full range of motion.  Impression and Recommendations:    The patient was counselled, risk factors were discussed, anticipatory guidance given.  Annual physical exam Routine physical as above, checking routine labs.  Essential hypertension, benign Blood pressure continues to be elevated, no  recent signs of organ damage. Adding valsartan 160. He will check his blood pressures at home over the next couple of weeks and let me know the numbers.  Male pattern baldness Apical male pattern baldness, adding high-dose Rogaine and Propecia. If no improvement over a year we will consider hair transplantation.   ___________________________________________ Gwen Her. Dianah Field, M.D., ABFM., CAQSM. Primary Care and Sports Medicine [facility]  Adjunct Professor of [place] of [facility] [employer]

## 2020-03-06 NOTE — Assessment & Plan Note (Signed)
Apical male pattern baldness, adding high-dose Rogaine and Propecia. If no improvement over a year we will consider hair transplantation.

## 2020-03-06 NOTE — Assessment & Plan Note (Signed)
Blood pressure continues to be elevated, no recent signs of organ damage. Adding valsartan 160. He will check his blood pressures at home over the next couple of weeks and let me know the numbers.

## 2020-03-06 NOTE — Assessment & Plan Note (Signed)
Routine physical as above, checking routine labs.

## 2020-03-07 ENCOUNTER — Telehealth: Payer: Self-pay | Admitting: Neurology

## 2020-03-07 NOTE — Telephone Encounter (Signed)
Finasteride PA submitted through covermymeds. Awaiting decision.

## 2020-03-22 ENCOUNTER — Other Ambulatory Visit: Payer: Self-pay | Admitting: Sports Medicine

## 2020-03-22 DIAGNOSIS — I10 Essential (primary) hypertension: Secondary | ICD-10-CM

## 2020-03-22 MED ORDER — VALSARTAN-HYDROCHLOROTHIAZIDE 160-12.5 MG PO TABS: 1.0000 | ORAL_TABLET | Freq: Every day | ORAL | 3 refills | Status: DC

## 2020-03-22 NOTE — Telephone Encounter (Signed)
Called to pharmacy 

## 2020-03-22 NOTE — Assessment & Plan Note (Signed)
Blood pressures continue to be elevated over 159 systolic, switching to valsartan/HCTZ 160/12.5, we will go up to 320/25 if not improved in 2 weeks.

## 2020-03-25 ENCOUNTER — Telehealth: Payer: Self-pay

## 2020-03-25 MED FILL — VALSARTAN-HCTZ 160-12.5 MG: 160-12.5 | 30 days supply | Qty: 30 | Fill #0

## 2020-03-25 NOTE — Telephone Encounter (Signed)
Call from [facility] to verify that patient was supposed to be switched to valsartan/HCTZ.  Per chart note, verified with pharmacy new information.

## 2020-03-27 ENCOUNTER — Telehealth: Payer: Self-pay

## 2020-03-27 NOTE — Telephone Encounter (Signed)
PA for the finasteride has been denied. Not sure what information was given on the PA. PA processed a few weeks ago.

## 2020-03-27 NOTE — Telephone Encounter (Signed)
That is fine it is pretty cheap with a good Rx coupon at most pharmacies, please call Dr. Raeford Razor and if he has not yet picked it up let him know I can send it anywhere that he would like and he can just download the good Rx for the pharmacy.

## 2020-05-06 ENCOUNTER — Ambulatory Visit: Payer: 59 | Attending: Internal Medicine

## 2020-05-06 ENCOUNTER — Other Ambulatory Visit (HOSPITAL_BASED_OUTPATIENT_CLINIC_OR_DEPARTMENT_OTHER): Payer: Self-pay | Admitting: Internal Medicine

## 2020-05-06 DIAGNOSIS — Z23 Encounter for immunization: Secondary | ICD-10-CM

## 2020-05-06 MED FILL — PFIZER-BIONTECH COVID-19 VA: 30 | 1 days supply | Qty: 0 | Fill #0

## 2020-05-06 MED FILL — VALSARTAN-HCTZ 160-12.5 MG: 160-12.5 | 30 days supply | Qty: 30 | Fill #1

## 2020-05-06 NOTE — Progress Notes (Signed)
° °  Covid-19 Vaccination Clinic  Name:  Victor FRALIX, MD    MRN: [id number] DOB: 1980-09-20  05/06/2020  [name] was observed post Covid-19 immunization for 15 minutes without incident. He was provided with Vaccine Information Sheet and instruction to access the V-Safe system.   [name] was instructed to call 911 with any severe reactions post vaccine:  Difficulty breathing   Swelling of face and throat   A fast heartbeat   A bad rash all over body   Dizziness and weakness   Immunizations Administered    Name Date Dose VIS Date Route   Pfizer COVID-19 Vaccine 05/06/2020 12:55 PM 0.3 mL 03/27/2020 Intramuscular   Manufacturer: [place]   Lot: [id number]   [place]: [id number]

## 2020-05-28 ENCOUNTER — Other Ambulatory Visit (HOSPITAL_BASED_OUTPATIENT_CLINIC_OR_DEPARTMENT_OTHER): Payer: Self-pay | Admitting: Dentistry

## 2020-05-28 MED FILL — AMOXICILLIN 875 MG TABS: 875 | 10 days supply | Qty: 20 | Fill #0

## 2020-05-28 MED FILL — CHLORHEXIDINE 0.12% RINSE: 0.12 | 16 days supply | Qty: 473 | Fill #0

## 2020-06-06 MED FILL — VALSARTAN-HCTZ 160-12.5 MG: 160-12.5 | 30 days supply | Qty: 30 | Fill #2

## 2020-06-11 MED FILL — CHLORHEXIDINE 0.12% RINSE: 0.12 | 16 days supply | Qty: 473 | Fill #1

## 2020-07-04 MED FILL — VALSARTAN-HCTZ 160-12.5 MG: 160-12.5 | 30 days supply | Qty: 30 | Fill #3

## 2020-07-04 MED FILL — CHLORHEXIDINE 0.12% RINSE: 0.12 | 16 days supply | Qty: 473 | Fill #2

## 2020-07-30 MED FILL — CHLORHEXIDINE 0.12% RINSE: 0.12 | 16 days supply | Qty: 473 | Fill #3

## 2020-08-28 ENCOUNTER — Other Ambulatory Visit: Payer: Self-pay | Admitting: Sports Medicine

## 2020-08-28 DIAGNOSIS — I10 Essential (primary) hypertension: Secondary | ICD-10-CM

## 2020-08-28 MED ORDER — VALSARTAN-HYDROCHLOROTHIAZIDE 160-12.5 MG PO TABS: 1.0000 | ORAL_TABLET | Freq: Every day | ORAL | 1 refills | Status: DC

## 2020-08-28 MED FILL — VALSARTAN-HCTZ 160-12.5 MG: 160-12.5 | 90 days supply | Qty: 90 | Fill #0

## 2020-12-16 ENCOUNTER — Other Ambulatory Visit (HOSPITAL_BASED_OUTPATIENT_CLINIC_OR_DEPARTMENT_OTHER): Payer: Self-pay

## 2020-12-16 MED FILL — Valsartan-Hydrochlorothiazide Tab 160-12.5 MG: ORAL | 90 days supply | Qty: 90 | Fill #0 | Status: AC

## 2020-12-19 DIAGNOSIS — M545 Low back pain, unspecified: Secondary | ICD-10-CM | POA: Diagnosis not present

## 2020-12-26 DIAGNOSIS — M545 Low back pain, unspecified: Secondary | ICD-10-CM | POA: Diagnosis not present

## 2021-01-09 DIAGNOSIS — M545 Low back pain, unspecified: Secondary | ICD-10-CM | POA: Diagnosis not present

## 2021-01-15 DIAGNOSIS — M545 Low back pain, unspecified: Secondary | ICD-10-CM | POA: Diagnosis not present

## 2021-01-29 DIAGNOSIS — M545 Low back pain, unspecified: Secondary | ICD-10-CM | POA: Diagnosis not present

## 2021-02-06 DIAGNOSIS — M545 Low back pain, unspecified: Secondary | ICD-10-CM | POA: Diagnosis not present

## 2021-03-05 DIAGNOSIS — M545 Low back pain, unspecified: Secondary | ICD-10-CM | POA: Diagnosis not present

## 2021-03-19 DIAGNOSIS — M545 Low back pain, unspecified: Secondary | ICD-10-CM | POA: Diagnosis not present

## 2021-03-31 ENCOUNTER — Other Ambulatory Visit: Payer: Self-pay | Admitting: Sports Medicine

## 2021-03-31 ENCOUNTER — Other Ambulatory Visit (HOSPITAL_BASED_OUTPATIENT_CLINIC_OR_DEPARTMENT_OTHER): Payer: Self-pay

## 2021-03-31 DIAGNOSIS — I10 Essential (primary) hypertension: Secondary | ICD-10-CM

## 2021-03-31 MED ORDER — VALSARTAN-HYDROCHLOROTHIAZIDE 160-12.5 MG PO TABS
1.0000 | ORAL_TABLET | Freq: Every day | ORAL | 1 refills | Status: DC
  Filled 2021-03-31: qty 90, 90d supply, fill #0
  Filled 2021-07-08: qty 90, 90d supply, fill #1

## 2021-04-01 ENCOUNTER — Other Ambulatory Visit (HOSPITAL_BASED_OUTPATIENT_CLINIC_OR_DEPARTMENT_OTHER): Payer: Self-pay

## 2021-04-01 MED ORDER — INFLUENZA VAC SPLIT QUAD 0.5 ML IM SUSY
PREFILLED_SYRINGE | INTRAMUSCULAR | 0 refills | Status: DC
  Filled 2021-04-01: qty 0.5, 1d supply, fill #0

## 2021-05-28 ENCOUNTER — Other Ambulatory Visit (HOSPITAL_BASED_OUTPATIENT_CLINIC_OR_DEPARTMENT_OTHER): Payer: Self-pay

## 2021-05-28 DIAGNOSIS — L814 Other melanin hyperpigmentation: Secondary | ICD-10-CM | POA: Diagnosis not present

## 2021-05-28 DIAGNOSIS — L821 Other seborrheic keratosis: Secondary | ICD-10-CM | POA: Diagnosis not present

## 2021-05-28 DIAGNOSIS — D492 Neoplasm of unspecified behavior of bone, soft tissue, and skin: Secondary | ICD-10-CM | POA: Diagnosis not present

## 2021-05-28 DIAGNOSIS — D2372 Other benign neoplasm of skin of left lower limb, including hip: Secondary | ICD-10-CM | POA: Diagnosis not present

## 2021-05-28 DIAGNOSIS — D225 Melanocytic nevi of trunk: Secondary | ICD-10-CM | POA: Diagnosis not present

## 2021-05-28 DIAGNOSIS — L309 Dermatitis, unspecified: Secondary | ICD-10-CM | POA: Diagnosis not present

## 2021-05-28 MED ORDER — FLUOCINONIDE 0.05 % EX OINT
TOPICAL_OINTMENT | CUTANEOUS | 1 refills | Status: DC
  Filled 2021-05-28: qty 30, 30d supply, fill #0

## 2021-07-08 ENCOUNTER — Other Ambulatory Visit (HOSPITAL_BASED_OUTPATIENT_CLINIC_OR_DEPARTMENT_OTHER): Payer: Self-pay

## 2021-10-15 ENCOUNTER — Other Ambulatory Visit (HOSPITAL_BASED_OUTPATIENT_CLINIC_OR_DEPARTMENT_OTHER): Payer: Self-pay

## 2021-10-15 ENCOUNTER — Encounter: Payer: Self-pay | Admitting: Sports Medicine

## 2021-10-15 ENCOUNTER — Other Ambulatory Visit (HOSPITAL_COMMUNITY): Payer: Self-pay

## 2021-10-15 ENCOUNTER — Other Ambulatory Visit: Payer: Self-pay | Admitting: Sports Medicine

## 2021-10-15 DIAGNOSIS — I10 Essential (primary) hypertension: Secondary | ICD-10-CM

## 2021-10-15 MED ORDER — VALSARTAN-HYDROCHLOROTHIAZIDE 160-12.5 MG PO TABS
1.0000 | ORAL_TABLET | Freq: Every day | ORAL | 3 refills | Status: DC
  Filled 2021-10-15 (×2): qty 90, 90d supply, fill #0
  Filled 2022-01-19: qty 90, 90d supply, fill #1
  Filled 2022-04-27: qty 90, 90d supply, fill #2
  Filled 2022-07-21: qty 90, 90d supply, fill #3

## 2021-10-15 MED ORDER — VALSARTAN-HYDROCHLOROTHIAZIDE 160-12.5 MG PO TABS
1.0000 | ORAL_TABLET | Freq: Every day | ORAL | 0 refills | Status: DC
  Filled 2021-10-15: qty 90, 90d supply, fill #0

## 2021-10-16 ENCOUNTER — Other Ambulatory Visit (HOSPITAL_BASED_OUTPATIENT_CLINIC_OR_DEPARTMENT_OTHER): Payer: Self-pay

## 2022-01-19 ENCOUNTER — Other Ambulatory Visit (HOSPITAL_BASED_OUTPATIENT_CLINIC_OR_DEPARTMENT_OTHER): Payer: Self-pay

## 2022-01-20 ENCOUNTER — Other Ambulatory Visit (HOSPITAL_COMMUNITY): Payer: Self-pay

## 2022-03-05 ENCOUNTER — Ambulatory Visit (INDEPENDENT_AMBULATORY_CARE_PROVIDER_SITE_OTHER): Payer: 59 | Admitting: Sports Medicine

## 2022-03-05 DIAGNOSIS — I10 Essential (primary) hypertension: Secondary | ICD-10-CM

## 2022-03-05 DIAGNOSIS — Z23 Encounter for immunization: Secondary | ICD-10-CM | POA: Diagnosis not present

## 2022-03-05 DIAGNOSIS — Z3009 Encounter for other general counseling and advice on contraception: Secondary | ICD-10-CM

## 2022-03-05 DIAGNOSIS — E7800 Pure hypercholesterolemia, unspecified: Secondary | ICD-10-CM | POA: Diagnosis not present

## 2022-03-05 DIAGNOSIS — L821 Other seborrheic keratosis: Secondary | ICD-10-CM

## 2022-03-05 DIAGNOSIS — Z0000 Encounter for general adult medical examination without abnormal findings: Secondary | ICD-10-CM

## 2022-03-05 NOTE — Addendum Note (Signed)
Addended by: Dema Severin on: 03/05/2022 10:25 AM   Modules accepted: Orders

## 2022-03-05 NOTE — Assessment & Plan Note (Signed)
Patient is considering vasectomy, his wife is pregnant with their fourth child. He will let me know when he would like the referral.

## 2022-03-05 NOTE — Assessment & Plan Note (Signed)
Annual physical as above, Tdap today. He will get his flu shot at a meeting. Return to see me in 1 year for annual physical.

## 2022-03-05 NOTE — Progress Notes (Addendum)
Subjective:    CC: Annual Physical Exam  HPI:  This patient is here for their annual physical  I reviewed the past medical history, family history, social history, surgical history, and allergies today and no changes were needed.  Please see the problem list section below in epic for further details.  Past Medical History: Past Medical History:  Diagnosis Date   History of microdiscectomy 2006 and 2014   herniated disc   Hypertension    Past Surgical History: Past Surgical History:  Procedure Laterality Date   BACK SURGERY     microdiskectomy x2 2006, 2014   ESOPHAGOGASTRODUODENOSCOPY     as child   INCISION AND DRAINAGE WOUND WITH FOREIGN BODY REMOVAL Left 07/15/2017   Procedure: LEFT INDEX FINGER INCISION AND DRAINAGE;  Surgeon: Iran Planas, MD;  Location: [place];  Service: Orthopedics;  Laterality: Left;   WISDOM TOOTH EXTRACTION     Social History: Social History   Socioeconomic History   Marital status: Married    Spouse name: Not on file   Number of children: Not on file   Years of education: Not on file   Highest education level: Not on file  Occupational History   Not on file  Tobacco Use   Smoking status: Never   Smokeless tobacco: Never  Substance and Sexual Activity   Alcohol use: Yes   Drug use: No   Sexual activity: Not on file  Other Topics Concern   Not on file  Social History Narrative   Not on file   Social Determinants of Health   Financial Resource Strain: Not on file  Food Insecurity: Not on file  Transportation Needs: Not on file  Physical Activity: Not on file  Stress: Not on file  Social Connections: Not on file   Family History: No family history on file. Allergies: No Known Allergies Medications: See med rec.  Review of Systems: No headache, visual changes, nausea, vomiting, diarrhea, constipation, dizziness, abdominal pain, skin rash, fevers, chills, night sweats, swollen lymph nodes, weight loss, chest pain, body aches,  joint swelling, muscle aches, shortness of breath, mood changes, visual or auditory hallucinations.  Objective:    General: Well Developed, well nourished, and in no acute distress.  Neuro: Alert and oriented x3, extra-ocular muscles intact, sensation grossly intact. Cranial nerves II through XII are intact, motor, sensory, and coordinative functions are all intact. HEENT: Normocephalic, atraumatic, pupils equal round reactive to light, neck supple, no masses, no lymphadenopathy, thyroid nonpalpable. Oropharynx, nasopharynx, external ear canals are unremarkable. Skin: Warm and dry, no rashes noted.  Seborrheic keratosis right posterior shoulder, other scattered benign-appearing nevi. Cardiac: Regular rate and rhythm, no murmurs rubs or gallops.  Respiratory: Clear to auscultation bilaterally. Not using accessory muscles, speaking in full sentences.  Abdominal: Soft, nontender, nondistended, positive bowel sounds, no masses, no organomegaly.  Musculoskeletal: Shoulder, elbow, wrist, hip, knee, ankle stable, and with full range of motion.  Procedure:  Cryodestruction of right posterior shoulder seborrheic keratosis Consent obtained and verified. Time-out conducted. Noted no overlying erythema, induration, or other signs of local infection. Completed without difficulty using Cryo-Gun. Advised to call if fevers/chills, erythema, induration, drainage, or persistent bleeding.  Impression and Recommendations:    The patient was counselled, risk factors were discussed, anticipatory guidance given.  Annual physical exam Annual physical as above, Tdap today. He will get his flu shot at a meeting. Return to see me in 1 year for annual physical.  Seborrheic keratosis Posterior shoulder, cryotherapy performed.  Vasectomy  evaluation Patient is considering vasectomy, his wife is pregnant with their fourth child. He will let me know when he would like the referral.  Hyperlipidemia Lipids  continue to be quite elevated, he will work aggressively on diet and exercise and then we can recheck in several months.  He does desire cardio IQ panel for next lipid check.   ____________________________________________ Gwen Her. Dianah Field, M.D., ABFM., CAQSM., AME. Primary Care and Sports Medicine [facility]  Adjunct Professor of [place] of [facility] [employer]  Risk manager

## 2022-03-05 NOTE — Assessment & Plan Note (Signed)
Posterior shoulder, cryotherapy performed.

## 2022-03-06 LAB — LIPID PANEL
Cholesterol: 264 mg/dL — ABNORMAL HIGH (ref <200)
HDL: 59 mg/dL (ref >=40)
LDL Cholesterol (Calc): 173 mg/dL (calc) — ABNORMAL HIGH
Non-HDL Cholesterol (Calc): 205 mg/dL (calc) — ABNORMAL HIGH (ref <130)
Total CHOL/HDL Ratio: 4.5 (calc) (ref <5.0)
Triglycerides: 171 mg/dL — ABNORMAL HIGH (ref <150)

## 2022-03-06 LAB — COMPLETE METABOLIC PANEL WITH GFR
AG Ratio: 1.6 (calc) (ref 1.0–2.5)
ALT: 62 U/L — ABNORMAL HIGH (ref 9–46)
AST: 37 U/L (ref 10–40)
Albumin: 4.8 g/dL (ref 3.6–5.1)
Alkaline phosphatase (APISO): 57 U/L (ref 36–130)
BUN: 18 mg/dL (ref 7–25)
CO2: 27 mmol/L (ref 20–32)
Calcium: 10.1 mg/dL (ref 8.6–10.3)
Chloride: 101 mmol/L (ref 98–110)
Creat: 1.24 mg/dL (ref 0.60–1.26)
Globulin: 3 g/dL (calc) (ref 1.9–3.7)
Glucose, Bld: 96 mg/dL (ref 65–99)
Potassium: 4.7 mmol/L (ref 3.5–5.3)
Sodium: 136 mmol/L (ref 135–146)
Total Bilirubin: 0.9 mg/dL (ref 0.2–1.2)
Total Protein: 7.8 g/dL (ref 6.1–8.1)
eGFR: 76 mL/min/{1.73_m2} (ref >=60)

## 2022-03-06 LAB — CBC
HCT: 48.3 % (ref 38.5–50.0)
Hemoglobin: 16.9 g/dL (ref 13.2–17.1)
MCH: 30.3 pg (ref 27.0–33.0)
MCHC: 35 g/dL (ref 32.0–36.0)
MCV: 86.6 fL (ref 80.0–100.0)
MPV: 11 fL (ref 7.5–12.5)
Platelets: 280 10*3/uL (ref 140–400)
RBC: 5.58 10*6/uL (ref 4.20–5.80)
RDW: 12.9 % (ref 11.0–15.0)
WBC: 6.9 10*3/uL (ref 3.8–10.8)

## 2022-03-06 LAB — HEMOGLOBIN A1C
Hgb A1c MFr Bld: 5.1 % of total Hgb (ref <5.7)
Mean Plasma Glucose: 100 mg/dL
eAG (mmol/L): 5.5 mmol/L

## 2022-03-06 LAB — TSH: TSH: 1.78 mIU/L (ref 0.40–4.50)

## 2022-03-09 NOTE — Assessment & Plan Note (Signed)
Lipids continue to be quite elevated, he will work aggressively on diet and exercise and then we can recheck in several months.  He does desire cardio IQ panel for next lipid check.

## 2022-03-16 ENCOUNTER — Other Ambulatory Visit (HOSPITAL_BASED_OUTPATIENT_CLINIC_OR_DEPARTMENT_OTHER): Payer: Self-pay

## 2022-03-16 MED ORDER — FLUARIX QUADRIVALENT 0.5 ML IM SUSY
PREFILLED_SYRINGE | INTRAMUSCULAR | 0 refills | Status: DC
  Filled 2022-03-16: qty 0.5, 1d supply, fill #0

## 2022-04-23 ENCOUNTER — Ambulatory Visit
Admission: EM | Admit: 2022-04-23 | Discharge: 2022-04-23 | Disposition: A | Discharge status: Home / Self Care | Payer: 59 | Attending: Family Medicine | Admitting: Family Medicine

## 2022-04-23 ENCOUNTER — Ambulatory Visit (INDEPENDENT_AMBULATORY_CARE_PROVIDER_SITE_OTHER): Payer: 59

## 2022-04-23 DIAGNOSIS — R059 Cough, unspecified: Secondary | ICD-10-CM | POA: Diagnosis not present

## 2022-04-23 DIAGNOSIS — R509 Fever, unspecified: Secondary | ICD-10-CM

## 2022-04-23 DIAGNOSIS — J189 Pneumonia, unspecified organism: Secondary | ICD-10-CM | POA: Diagnosis not present

## 2022-04-23 LAB — POC SARS CORONAVIRUS 2 AG -  ED: SARS Coronavirus 2 Ag: NEGATIVE

## 2022-04-23 LAB — POC INFLUENZA A AND B ANTIGEN (URGENT CARE ONLY)
Influenza A Ag: NEGATIVE
Influenza B Ag: NEGATIVE

## 2022-04-23 MED ORDER — AZITHROMYCIN 250 MG PO TABS: ORAL_TABLET | ORAL | 0 refills | Status: DC

## 2022-04-23 MED ORDER — ACETAMINOPHEN 500 MG PO TABS
1000.0000 mg | ORAL_TABLET | Freq: Once | ORAL | Status: AC
  Administered 2022-04-23: 1000 mg via ORAL

## 2022-04-23 MED ORDER — AMOXICILLIN-POT CLAVULANATE 875-125 MG PO TABS: 1.0000 | ORAL_TABLET | Freq: Two times a day (BID) | ORAL | 0 refills | Status: DC

## 2022-04-23 MED ORDER — HYDROCOD POLI-CHLORPHE POLI ER 10-8 MG/5ML PO SUER: 5.0000 mL | Freq: Two times a day (BID) | ORAL | 0 refills | Status: DC | PRN

## 2022-04-23 NOTE — ED Triage Notes (Signed)
Pt c/o cough and fever since Sunday. '600mg'$  motrin at 5pm.

## 2022-04-23 NOTE — Discharge Instructions (Signed)
Take the Z-Pak as directed.  2 pills today then 1 a day until gone Take Augmentin 2 times a day for 7 days Consider taking a probiotic while on antibiotics to protect your GI tract Make sure you are drinking lots of water Run a minified air in the bedroom May take over-the-counter cough medicines like Mucinex or Delsym as needed I have prescribed Tussionex to take at nighttime, may take twice a day as needed for cough Get plenty of rest

## 2022-04-23 NOTE — ED Provider Notes (Signed)
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   HPI Victor Horn is on his fifth day of illness.  He has significant coughing.  Chest pain with coughing.  Fatigue.  Sweats.  Fever every day.  Headaches and body aches, decreased appetite.  Cannot sleep because of the cough.  He is a Chief Technology Officer.  Unknown exposure to illness.  No one at home is sick  Past Medical History:  Diagnosis Date   History of microdiscectomy 2006 and 2014   herniated disc   Hypertension     Patient Active Problem List   Diagnosis Date Noted   Seborrheic keratosis 03/05/2022   Vasectomy evaluation 03/05/2022   Male pattern baldness 03/06/2020   Hyperlipidemia 23/55/7322   Systolic murmur 02/54/2706   Essential hypertension, benign 01/05/2013   Annual physical exam 01/05/2013    Past Surgical History:  Procedure Laterality Date   BACK SURGERY     microdiskectomy x2 2006, 2014   ESOPHAGOGASTRODUODENOSCOPY     as child   INCISION AND DRAINAGE WOUND WITH FOREIGN BODY REMOVAL Left 07/15/2017   Procedure: LEFT INDEX FINGER INCISION AND DRAINAGE;  Surgeon: Iran Planas, MD;  Location: [place];  Service: Orthopedics;  Laterality: Left;   WISDOM TOOTH EXTRACTION         Home Medications    Prior to Admission medications   Medication Sig Start Date End Date Taking? Authorizing Provider  amoxicillin-clavulanate (AUGMENTIN) 875-125 MG tablet Take 1 tablet by mouth every 12 (twelve) hours. 04/23/22  Yes Raylene Everts, MD  azithromycin (ZITHROMAX Z-PAK) 250 MG tablet Take two pills today followed by one a day until gone 04/23/22  Yes Raylene Everts, MD  chlorpheniramine-HYDROcodone (TUSSIONEX) 10-8 MG/5ML Take 5 mLs by mouth every 12 (twelve) hours as needed for cough. 04/23/22  Yes Raylene Everts, MD  finasteride (PROPECIA) 1 MG  tablet Take 1 tablet (1 mg total) by mouth daily. 03/06/20   Silverio Decamp, MD  fluocinonide ointment (LIDEX) 0.05 % Apply to affected area(s) twice daily as needed for itch 05/28/21     Minoxidil 5 % FOAM Apply 1 application topically 2 (two) times daily. Apply twice daily for up to 4 months 03/06/20   Silverio Decamp, MD  valsartan-hydrochlorothiazide (DIOVAN-HCT) 160-12.5 MG tablet Take 1 tablet by mouth daily. 10/15/21 10/15/22  Silverio Decamp, MD    Family History History reviewed. No pertinent family history.  Social History Social History   Tobacco Use   Smoking status: Never   Smokeless tobacco: Never  Substance Use Topics   Alcohol use: Yes   Drug use: No     Allergies   Patient has no known allergies.   Review of Systems Review of Systems See HPI  Physical Exam Triage Vital Signs ED Triage Vitals  Enc Vitals Group     BP 04/23/22 1851 (!) 165/76     Pulse Rate 04/23/22 1851 95     Resp 04/23/22 1851 18     Temp 04/23/22 1851 100.1 F (37.8 C)     Temp Source 04/23/22 1851 Oral     SpO2 04/23/22 1851 95 %     Weight --      Height --      Head Circumference --  Peak Flow --      Pain Score 04/23/22 1852 0     Pain Loc --      Pain Edu? --      Excl. in [place]? --    No data found.  Updated Vital Signs BP (!) 154/99 (BP Location: Left Arm)   Pulse 92   Temp 100.1 F (37.8 C) (Oral)   Resp 20   SpO2 95%      Physical Exam Constitutional:      General: He is not in acute distress.    Appearance: He is well-developed. He is ill-appearing.     Comments: Muscular  HENT:     Head: Normocephalic and atraumatic.     Right Ear: Tympanic membrane and ear canal normal.     Left Ear: Tympanic membrane and ear canal normal.     Nose: Nose normal.     Mouth/Throat:     Pharynx: No posterior oropharyngeal erythema.  Eyes:     Conjunctiva/sclera: Conjunctivae normal.     Pupils: Pupils are equal, round, and reactive to light.   Cardiovascular:     Rate and Rhythm: Normal rate and regular rhythm.     Heart sounds: Normal heart sounds.  Pulmonary:     Effort: Pulmonary effort is normal. No respiratory distress.     Breath sounds: Rales present.     Comments: Coughing with attempted deep breath. rales right mid, no wheeze Abdominal:     General: There is no distension.     Palpations: Abdomen is soft.  Musculoskeletal:        General: Normal range of motion.     Cervical back: Normal range of motion.  Skin:    General: Skin is warm and dry.  Neurological:     Mental Status: He is alert.     Gait: Gait normal.      UC Treatments / Results  Labs (all labs ordered are listed, but only abnormal results are displayed) Labs Reviewed  POC SARS CORONAVIRUS 2 AG -  ED  POC INFLUENZA A AND B ANTIGEN (URGENT CARE ONLY)    EKG   Radiology DG Chest 2 View  Result Date: 04/23/2022 CLINICAL DATA:  Cough and fever EXAM: CHEST - 2 VIEW COMPARISON:  None Available. FINDINGS: Patchy airspace opacities in the right thorax suspicious for pneumonia. Normal cardiac size. No pneumothorax IMPRESSION: Patchy airspace opacities in the right thorax suspicious for pneumonia. Electronically Signed   By: Donavan Foil M.D.   On: 04/23/2022 19:35    Procedures Procedures (including critical care time)  Medications Ordered in UC Medications  acetaminophen (TYLENOL) tablet 1,000 mg (1,000 mg Oral Given 04/23/22 1855)    Initial Impression / Assessment and Plan / UC Course  I have reviewed the triage vital signs and the nursing notes.  Pertinent labs & imaging results that were available during my care of the patient were reviewed by me and considered in my medical decision making (see chart for details).     Final Clinical Impressions(s) / UC Diagnoses   Final diagnoses:  Community acquired pneumonia of right lung, unspecified part of lung     Discharge Instructions      Take the Z-Pak as directed.  2 pills  today then 1 a day until gone Take Augmentin 2 times a day for 7 days Consider taking a probiotic while on antibiotics to protect your GI tract Make sure you are drinking lots of water Run a minified air in  the bedroom May take over-the-counter cough medicines like Mucinex or Delsym as needed I have prescribed Tussionex to take at nighttime, may take twice a day as needed for cough Get plenty of rest     ED Prescriptions     Medication Sig Dispense Auth. Provider   azithromycin (ZITHROMAX Z-PAK) 250 MG tablet Take two pills today followed by one a day until gone 6 tablet Raylene Everts, MD   amoxicillin-clavulanate (AUGMENTIN) 875-125 MG tablet Take 1 tablet by mouth every 12 (twelve) hours. 14 tablet Raylene Everts, MD   chlorpheniramine-HYDROcodone (TUSSIONEX) 10-8 MG/5ML Take 5 mLs by mouth every 12 (twelve) hours as needed for cough. 115 mL Raylene Everts, MD      I have reviewed the PDMP during this encounter.   Raylene Everts, MD 04/23/22 [phone]

## 2022-04-24 ENCOUNTER — Telehealth: Payer: Self-pay | Admitting: Emergency Medicine

## 2022-04-27 ENCOUNTER — Other Ambulatory Visit (HOSPITAL_BASED_OUTPATIENT_CLINIC_OR_DEPARTMENT_OTHER): Payer: Self-pay

## 2022-05-04 ENCOUNTER — Encounter: Payer: Self-pay | Admitting: Family Medicine

## 2022-05-04 ENCOUNTER — Other Ambulatory Visit (HOSPITAL_BASED_OUTPATIENT_CLINIC_OR_DEPARTMENT_OTHER): Payer: Self-pay

## 2022-05-04 ENCOUNTER — Other Ambulatory Visit: Payer: Self-pay | Admitting: *Deleted

## 2022-05-04 MED ORDER — BUDESONIDE-FORMOTEROL FUMARATE 80-4.5 MCG/ACT IN AERO
2.0000 | INHALATION_SPRAY | Freq: Two times a day (BID) | RESPIRATORY_TRACT | 3 refills | Status: DC
  Filled 2022-05-04: qty 10.2, 30d supply, fill #0

## 2022-05-04 NOTE — Progress Notes (Signed)
Patient with recent diagnosis of community-acquired pneumonia.  Overall improving with azithromycin, augmentin, tussionex for cough.  Still with some wheezing and reactive airways, cough.  Sent in symbicort to take as directed.

## 2022-05-18 DIAGNOSIS — L570 Actinic keratosis: Secondary | ICD-10-CM | POA: Diagnosis not present

## 2022-05-18 DIAGNOSIS — D2372 Other benign neoplasm of skin of left lower limb, including hip: Secondary | ICD-10-CM | POA: Diagnosis not present

## 2022-05-18 DIAGNOSIS — L814 Other melanin hyperpigmentation: Secondary | ICD-10-CM | POA: Diagnosis not present

## 2022-05-18 DIAGNOSIS — L821 Other seborrheic keratosis: Secondary | ICD-10-CM | POA: Diagnosis not present

## 2022-06-03 ENCOUNTER — Encounter: Payer: Self-pay | Admitting: Emergency Medicine

## 2022-06-03 ENCOUNTER — Ambulatory Visit
Admission: EM | Admit: 2022-06-03 | Discharge: 2022-06-03 | Disposition: A | Discharge status: Home / Self Care | Payer: 59 | Attending: Family Medicine | Admitting: Family Medicine

## 2022-06-03 DIAGNOSIS — J020 Streptococcal pharyngitis: Secondary | ICD-10-CM

## 2022-06-03 LAB — POCT RAPID STREP A (OFFICE): Rapid Strep A Screen: POSITIVE — AB

## 2022-06-03 LAB — POCT INFLUENZA A/B
Influenza A, POC: NEGATIVE
Influenza B, POC: NEGATIVE

## 2022-06-03 LAB — POC SARS CORONAVIRUS 2 AG -  ED: SARS Coronavirus 2 Ag: NEGATIVE

## 2022-06-03 MED ORDER — PENICILLIN V POTASSIUM 500 MG PO TABS: 500.0000 mg | ORAL_TABLET | Freq: Two times a day (BID) | ORAL | 0 refills | Status: AC

## 2022-06-03 NOTE — Discharge Instructions (Addendum)
Take penicillin 2 times a day for 10 full days

## 2022-06-03 NOTE — ED Provider Notes (Signed)
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   HPI  Sore throat with enlarged tonsils, fever chills and body aches for 2 days.  Kids at home not sick.  No known exposure to illness.  Past Medical History:  Diagnosis Date   History of microdiscectomy 2006 and 2014   herniated disc   Hypertension     Patient Active Problem List   Diagnosis Date Noted   Seborrheic keratosis 03/05/2022   Vasectomy evaluation 03/05/2022   Male pattern baldness 03/06/2020   Hyperlipidemia 63/78/5885   Systolic murmur 02/77/4128   Essential hypertension, benign 01/05/2013   Annual physical exam 01/05/2013    Past Surgical History:  Procedure Laterality Date   BACK SURGERY     microdiskectomy x2 2006, 2014   ESOPHAGOGASTRODUODENOSCOPY     as child   INCISION AND DRAINAGE WOUND WITH FOREIGN BODY REMOVAL Left 07/15/2017   Procedure: LEFT INDEX FINGER INCISION AND DRAINAGE;  Surgeon: Iran Planas, MD;  Location: [facility];  Service: Orthopedics;  Laterality: Left;   WISDOM TOOTH EXTRACTION         Home Medications    Prior to Admission medications   Medication Sig Start Date End Date Taking? Authorizing Provider  penicillin v potassium (VEETID) 500 MG tablet Take 1 tablet (500 mg total) by mouth 2 (two) times daily for 10 days. 06/03/22 06/13/22 Yes Raylene Everts, MD  valsartan-hydrochlorothiazide (DIOVAN-HCT) 160-12.5 MG tablet Take 1 tablet by mouth daily. 10/15/21 10/15/22 Yes Silverio Decamp, MD    Family History History reviewed. No pertinent family history.  Social History Social History   Tobacco Use   Smoking status: Never   Smokeless tobacco: Never  Vaping Use   Vaping Use: Never used  Substance Use Topics   Alcohol use: Yes   Drug use: No     Allergies   Patient has no known  allergies.   Review of Systems Review of Systems  See HPI Physical Exam Triage Vital Signs ED Triage Vitals  Enc Vitals Group     BP 06/03/22 1102 (!) 148/97     Pulse Rate 06/03/22 1102 (!) 102     Resp 06/03/22 1102 18     Temp 06/03/22 1102 99.8 F (37.7 C)     Temp Source 06/03/22 1102 Oral     SpO2 06/03/22 1102 98 %     Weight 06/03/22 1104 260 lb (117.9 kg)     Height 06/03/22 1104 6' (1.829 m)     Head Circumference --      Peak Flow --      Pain Score 06/03/22 1104 7     Pain Loc --      Pain Edu? --      Excl. in [place]? --    No data found.  Updated Vital Signs BP (!) 148/97 (BP Location: Right Arm)   Pulse (!) 102   Temp 99.8 F (37.7 C) (Oral)   Resp 18   Ht 6' (1.829 m)   Wt 117.9 kg   SpO2 98%   BMI 35.26 kg/m       Physical Exam Constitutional:      General: He is not in acute distress.    Appearance: He is well-developed. He is ill-appearing.  HENT:  Head: Normocephalic and atraumatic.     Mouth/Throat:     Mouth: Mucous membranes are moist.     Pharynx: Oropharyngeal exudate and posterior oropharyngeal erythema present.  Eyes:     Conjunctiva/sclera: Conjunctivae normal.     Pupils: Pupils are equal, round, and reactive to light.  Cardiovascular:     Rate and Rhythm: Normal rate.  Pulmonary:     Effort: Pulmonary effort is normal. No respiratory distress.  Abdominal:     General: There is no distension.     Palpations: Abdomen is soft.  Musculoskeletal:        General: Normal range of motion.     Cervical back: Normal range of motion.  Skin:    General: Skin is warm and dry.  Neurological:     Mental Status: He is alert.      UC Treatments / Results  Labs (all labs ordered are listed, but only abnormal results are displayed) Labs Reviewed  POCT RAPID STREP A (OFFICE) - Abnormal; Notable for the following components:      Result Value   Rapid Strep A Screen Positive (*)    All other components within normal limits  POCT  INFLUENZA A/B  POC SARS CORONAVIRUS 2 AG -  ED    EKG   Radiology No results found.  Procedures Procedures (including critical care time)  Medications Ordered in UC Medications - No data to display  Initial Impression / Assessment and Plan / UC Course  I have reviewed the triage vital signs and the nursing notes.  Pertinent labs & imaging results that were available during my care of the patient were reviewed by me and considered in my medical decision making (see chart for details).     Final Clinical Impressions(s) / UC Diagnoses   Final diagnoses:  Streptococcal sore throat     Discharge Instructions      Take penicillin 2 times a day for 10 full days     ED Prescriptions     Medication Sig Dispense Auth. Provider   penicillin v potassium (VEETID) 500 MG tablet Take 1 tablet (500 mg total) by mouth 2 (two) times daily for 10 days. 20 tablet Raylene Everts, MD      PDMP not reviewed this encounter.   Raylene Everts, MD 06/03/22 1147

## 2022-06-03 NOTE — ED Triage Notes (Signed)
Patient c/o fever, body chills and sore throat x 2 days.  Fever as high as 101.3.  Patient has taken Tylenol and Ibuprofen.

## 2022-09-02 ENCOUNTER — Other Ambulatory Visit: Payer: Self-pay | Admitting: Sports Medicine

## 2022-09-02 DIAGNOSIS — Z3009 Encounter for other general counseling and advice on contraception: Secondary | ICD-10-CM

## 2022-09-23 ENCOUNTER — Ambulatory Visit (INDEPENDENT_AMBULATORY_CARE_PROVIDER_SITE_OTHER): Payer: 59 | Admitting: Urology

## 2022-09-23 ENCOUNTER — Encounter: Payer: Self-pay | Admitting: Urology

## 2022-09-23 ENCOUNTER — Other Ambulatory Visit (HOSPITAL_BASED_OUTPATIENT_CLINIC_OR_DEPARTMENT_OTHER): Payer: Self-pay

## 2022-09-23 VITALS — BP 156/94 | HR 82 | Ht 72.0 in | Wt 270.0 lb

## 2022-09-23 DIAGNOSIS — Z3009 Encounter for other general counseling and advice on contraception: Secondary | ICD-10-CM | POA: Diagnosis not present

## 2022-09-23 MED ORDER — ALPRAZOLAM 1 MG PO TABS
ORAL_TABLET | ORAL | 0 refills | Status: AC
  Filled 2022-09-23: qty 1, 1d supply, fill #0

## 2022-09-23 NOTE — Progress Notes (Signed)
Assessment: 1. Encounter for vasectomy assessment     Plan: Schedule for vasectomy per patient request Rx for alprazolam 1 mg pre-procedure provided.   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 He is married with 4 children.  No history of scrotal trauma or infection.   Past Medical History:  Past Medical History:  Diagnosis Date   History of microdiscectomy 2006 and 2014   herniated disc   Hypertension     Past Surgical History:  Past Surgical History:  Procedure Laterality Date   BACK SURGERY     microdiskectomy x2 2006, 2014   ESOPHAGOGASTRODUODENOSCOPY     as child   INCISION AND DRAINAGE WOUND WITH FOREIGN BODY REMOVAL Left 07/15/2017   Procedure: LEFT INDEX FINGER INCISION AND DRAINAGE;  Surgeon: Bradly Bienenstock, MD;  Location: [facility];  Service: Orthopedics;  Laterality: Left;   WISDOM TOOTH EXTRACTION      Allergies:  No Known Allergies  Family History:  No family history on file.  Social History:  Social History   Tobacco Use   Smoking status: Never   Smokeless tobacco: Never  Vaping Use   Vaping Use: Never used  Substance Use Topics   Alcohol use: Yes   Drug use: No    Review of symptoms:  Constitutional:  Negative for unexplained weight loss, night sweats, fever, chills ENT:  Negative for nose bleeds, sinus pain, painful swallowing CV:  Negative for chest pain, shortness of breath, exercise intolerance, palpitations, loss of consciousness Resp:  Negative for cough, wheezing, shortness of breath GI:  Negative for nausea, vomiting, diarrhea, bloody stools GU:  Positives noted in HPI; otherwise negative for gross hematuria, dysuria, urinary incontinence Neuro:  Negative for seizures, poor balance, limb weakness, slurred speech Psych:  Negative for lack of energy, depression, anxiety Endocrine:  Negative for  polydipsia, polyuria, symptoms of hypoglycemia (dizziness, hunger, sweating) Hematologic:  Negative for anemia, purpura, petechia, prolonged or excessive bleeding, use of anticoagulants  Allergic:  Negative for difficulty breathing or choking as a result of exposure to anything; no shellfish allergy; no allergic response (rash/itch) to materials, foods  Physical exam: BP (!) 156/94   Pulse 82   Ht 6' (1.829 m)   Wt 270 lb (122.5 kg)   BMI 36.62 kg/m  GENERAL APPEARANCE:  Well appearing, well developed, well nourished, NAD HEENT:  Atraumatic, normocephalic, oropharynx clear NECK:  Supple without lymphadenopathy or thyromegaly ABDOMEN:  Soft, non-tender, no masses EXTREMITIES:  Moves all extremities well, without clubbing, cyanosis, or edema NEUROLOGIC:  Alert and oriented x 3, normal gait, CN II-XII grossly intact MENTAL STATUS:  appropriate BACK:  Non-tender to palpation, No CVAT SKIN:  Warm, dry, and intact GU: Penis:  circumcised Meatus: Normal Scrotum: Vasa deferens palpated bilaterally, slightly more difficult to palpate on the left Testis: Palpated in the upper scrotum bilaterally Epididymis: normal  Results: None  VASECTOMY CONSULTATION  Victor Rude, 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  He is a 42 y.o. male, Married with 4  children .  He and his wife have discussed the issues regarding long-term fertility and are comfortable with this decision.  He presents for consideration for vasectomy.  I discussed the issues in detail with him today and he expressed no reservations.  As to the procedure, no scalpel technique vasectomy is explained and reviewed  in detail.  Generalized risks including but not limited to bleeding, infection, orchalgia, testicular atrophy, epididymitis, scrotal hematoma, and chronic pain are discussed.   Additionally, he understands that the possibility of vas recanalization following vasectomy is possible although rare.  Most importantly, the  patient understands that he is not sterile initially and will need a semen analysis check to confirm sterility such that no sperm are seen.  He is advised to avoid ejaculation for 10 days following the procedure.  The initial semen analysis will be checked in approximately 12 weeks and in some patients, several months may be required for clearance of all sperm.  He reports a clear understanding of the need for continued birth control until sterility is confirmed.  Otherwise, general issues regarding local anesthesia, prep, alprazolam are discussed and he reports a clear understanding.

## 2022-09-23 NOTE — Patient Instructions (Signed)

## 2022-10-15 ENCOUNTER — Encounter: Payer: 59 | Admitting: Urology

## 2022-10-19 ENCOUNTER — Other Ambulatory Visit: Payer: Self-pay | Admitting: Sports Medicine

## 2022-10-19 ENCOUNTER — Other Ambulatory Visit (HOSPITAL_BASED_OUTPATIENT_CLINIC_OR_DEPARTMENT_OTHER): Payer: Self-pay

## 2022-10-19 DIAGNOSIS — I10 Essential (primary) hypertension: Secondary | ICD-10-CM

## 2022-10-19 MED ORDER — VALSARTAN-HYDROCHLOROTHIAZIDE 160-12.5 MG PO TABS
1.0000 | ORAL_TABLET | Freq: Every day | ORAL | 3 refills | Status: AC
  Filled 2022-10-19: qty 90, 90d supply, fill #0
  Filled 2023-04-26: qty 30, 30d supply, fill #1
  Filled 2023-06-04: qty 90, 90d supply, fill #1

## 2022-11-04 ENCOUNTER — Other Ambulatory Visit (HOSPITAL_BASED_OUTPATIENT_CLINIC_OR_DEPARTMENT_OTHER): Payer: Self-pay

## 2022-11-04 ENCOUNTER — Other Ambulatory Visit: Payer: Self-pay | Admitting: Sports Medicine

## 2022-11-04 MED ORDER — MUPIROCIN 2 % EX OINT
1.0000 | TOPICAL_OINTMENT | Freq: Three times a day (TID) | CUTANEOUS | 0 refills | Status: AC
  Filled 2022-11-04: qty 22, 8d supply, fill #0

## 2022-11-13 ENCOUNTER — Telehealth: Payer: Self-pay | Admitting: Sports Medicine

## 2022-11-13 MED ORDER — PREDNISONE 50 MG PO TABS: ORAL_TABLET | ORAL | 0 refills | Status: AC

## 2022-11-13 NOTE — Telephone Encounter (Signed)
Azaryah calls in with poison ivy dermatitis, requesting prednisone be sent to a pharmacy in [place].

## 2023-04-27 ENCOUNTER — Other Ambulatory Visit: Payer: Self-pay

## 2023-04-27 ENCOUNTER — Encounter: Payer: Self-pay | Admitting: Pharmacist

## 2023-04-27 ENCOUNTER — Other Ambulatory Visit (HOSPITAL_COMMUNITY): Payer: Self-pay

## 2023-04-30 ENCOUNTER — Other Ambulatory Visit: Payer: Self-pay

## 2023-06-04 ENCOUNTER — Other Ambulatory Visit (HOSPITAL_COMMUNITY): Payer: Self-pay

## 2024-02-08 ENCOUNTER — Encounter: Payer: Self-pay | Admitting: Sports Medicine
